# Patient Record
Sex: Female | Born: 1945
Health system: Southern US, Community
[De-identification: ages and names within clinical notes are randomized; demographics above are authoritative.]

## PROBLEM LIST (undated history)

## (undated) DIAGNOSIS — D649 Anemia, unspecified: Secondary | ICD-10-CM

## (undated) DIAGNOSIS — F419 Anxiety disorder, unspecified: Secondary | ICD-10-CM

## (undated) DIAGNOSIS — R112 Nausea with vomiting, unspecified: Secondary | ICD-10-CM

## (undated) DIAGNOSIS — E785 Hyperlipidemia, unspecified: Secondary | ICD-10-CM

## (undated) DIAGNOSIS — E119 Type 2 diabetes mellitus without complications: Secondary | ICD-10-CM

## (undated) DIAGNOSIS — E669 Obesity, unspecified: Secondary | ICD-10-CM

## (undated) DIAGNOSIS — K589 Irritable bowel syndrome without diarrhea: Secondary | ICD-10-CM

## (undated) DIAGNOSIS — I10 Essential (primary) hypertension: Secondary | ICD-10-CM

## (undated) DIAGNOSIS — K219 Gastro-esophageal reflux disease without esophagitis: Secondary | ICD-10-CM

## (undated) DIAGNOSIS — G4700 Insomnia, unspecified: Secondary | ICD-10-CM

## (undated) DIAGNOSIS — Z8719 Personal history of other diseases of the digestive system: Secondary | ICD-10-CM

## (undated) DIAGNOSIS — Z9889 Other specified postprocedural states: Secondary | ICD-10-CM

## (undated) DIAGNOSIS — M1990 Unspecified osteoarthritis, unspecified site: Secondary | ICD-10-CM

## (undated) HISTORY — DX: Hyperlipidemia, unspecified: E78.5

## (undated) HISTORY — DX: Anxiety disorder, unspecified: F41.9

## (undated) HISTORY — PX: BREAST BIOPSY: SHX20

## (undated) HISTORY — DX: Essential (primary) hypertension: I10

## (undated) HISTORY — PX: SHOULDER ARTHROSCOPY WITH OPEN ROTATOR CUFF REPAIR: SHX6092

## (undated) HISTORY — PX: COLONOSCOPY: SHX174

## (undated) HISTORY — DX: Obesity, unspecified: E66.9

## (undated) HISTORY — PX: FOOT SURGERY: SHX648

## (undated) HISTORY — DX: Insomnia, unspecified: G47.00

## (undated) HISTORY — DX: Type 2 diabetes mellitus without complications: E11.9

---

## 1979-12-30 HISTORY — PX: TUBAL LIGATION: SHX77

## 1980-12-29 HISTORY — PX: ABDOMINAL HYSTERECTOMY: SHX81

## 1998-12-19 ENCOUNTER — Other Ambulatory Visit: Admission: RE | Admit: 1998-12-19 | Discharge: 1998-12-19 | Payer: Self-pay | Admitting: Gynecology

## 2000-03-03 ENCOUNTER — Other Ambulatory Visit: Admission: RE | Admit: 2000-03-03 | Discharge: 2000-03-03 | Payer: Self-pay | Admitting: Gynecology

## 2000-03-03 ENCOUNTER — Encounter: Admission: RE | Admit: 2000-03-03 | Discharge: 2000-03-03 | Payer: Self-pay | Admitting: Gynecology

## 2000-03-03 ENCOUNTER — Encounter: Payer: Self-pay | Admitting: Gynecology

## 2001-03-30 ENCOUNTER — Other Ambulatory Visit: Admission: RE | Admit: 2001-03-30 | Discharge: 2001-03-30 | Payer: Self-pay | Admitting: Gynecology

## 2001-03-31 ENCOUNTER — Encounter: Payer: Self-pay | Admitting: Gynecology

## 2001-03-31 ENCOUNTER — Encounter: Admission: RE | Admit: 2001-03-31 | Discharge: 2001-03-31 | Payer: Self-pay | Admitting: Gynecology

## 2002-04-18 ENCOUNTER — Encounter: Payer: Self-pay | Admitting: Gynecology

## 2002-04-18 ENCOUNTER — Encounter: Admission: RE | Admit: 2002-04-18 | Discharge: 2002-04-18 | Payer: Self-pay | Admitting: Gynecology

## 2002-04-19 ENCOUNTER — Other Ambulatory Visit: Admission: RE | Admit: 2002-04-19 | Discharge: 2002-04-19 | Payer: Self-pay | Admitting: Gynecology

## 2003-10-10 ENCOUNTER — Encounter: Admission: RE | Admit: 2003-10-10 | Discharge: 2003-10-10 | Payer: Self-pay | Admitting: Gynecology

## 2003-10-10 ENCOUNTER — Encounter: Payer: Self-pay | Admitting: Gynecology

## 2004-02-05 ENCOUNTER — Ambulatory Visit (HOSPITAL_COMMUNITY): Admission: RE | Admit: 2004-02-05 | Discharge: 2004-02-05 | Payer: Self-pay | Admitting: *Deleted

## 2004-03-01 ENCOUNTER — Other Ambulatory Visit: Admission: RE | Admit: 2004-03-01 | Discharge: 2004-03-01 | Payer: Self-pay | Admitting: Gynecology

## 2004-11-05 ENCOUNTER — Encounter: Admission: RE | Admit: 2004-11-05 | Discharge: 2004-11-05 | Payer: Self-pay | Admitting: Gynecology

## 2005-03-19 ENCOUNTER — Other Ambulatory Visit: Admission: RE | Admit: 2005-03-19 | Discharge: 2005-03-19 | Payer: Self-pay | Admitting: Gynecology

## 2005-12-09 ENCOUNTER — Encounter: Admission: RE | Admit: 2005-12-09 | Discharge: 2005-12-09 | Payer: Self-pay | Admitting: Gynecology

## 2006-04-21 ENCOUNTER — Other Ambulatory Visit: Admission: RE | Admit: 2006-04-21 | Discharge: 2006-04-21 | Payer: Self-pay | Admitting: Gynecology

## 2007-01-25 ENCOUNTER — Encounter: Admission: RE | Admit: 2007-01-25 | Discharge: 2007-01-25 | Payer: Self-pay | Admitting: Gynecology

## 2007-04-27 ENCOUNTER — Other Ambulatory Visit: Admission: RE | Admit: 2007-04-27 | Discharge: 2007-04-27 | Payer: Self-pay | Admitting: Gynecology

## 2008-02-17 ENCOUNTER — Encounter: Admission: RE | Admit: 2008-02-17 | Discharge: 2008-02-17 | Payer: Self-pay | Admitting: Gynecology

## 2009-02-19 ENCOUNTER — Encounter: Admission: RE | Admit: 2009-02-19 | Discharge: 2009-02-19 | Payer: Self-pay | Admitting: Gynecology

## 2010-02-20 ENCOUNTER — Encounter: Admission: RE | Admit: 2010-02-20 | Discharge: 2010-02-20 | Payer: Self-pay | Admitting: Gynecology

## 2011-03-04 ENCOUNTER — Other Ambulatory Visit: Payer: Self-pay | Admitting: Gynecology

## 2011-03-04 DIAGNOSIS — Z1231 Encounter for screening mammogram for malignant neoplasm of breast: Secondary | ICD-10-CM

## 2011-03-14 ENCOUNTER — Ambulatory Visit
Admission: RE | Admit: 2011-03-14 | Discharge: 2011-03-14 | Disposition: A | Discharge status: Home / Self Care | Payer: Commercial Indemnity | Source: Ambulatory Visit | Attending: Gynecology | Admitting: Gynecology

## 2011-03-14 DIAGNOSIS — Z1231 Encounter for screening mammogram for malignant neoplasm of breast: Secondary | ICD-10-CM

## 2012-05-10 ENCOUNTER — Other Ambulatory Visit: Payer: Self-pay | Admitting: Gynecology

## 2012-05-10 DIAGNOSIS — Z1231 Encounter for screening mammogram for malignant neoplasm of breast: Secondary | ICD-10-CM

## 2012-06-01 ENCOUNTER — Ambulatory Visit
Admission: RE | Admit: 2012-06-01 | Discharge: 2012-06-01 | Disposition: A | Discharge status: Home / Self Care | Payer: 59 | Source: Ambulatory Visit | Attending: Gynecology | Admitting: Gynecology

## 2012-06-01 DIAGNOSIS — Z1231 Encounter for screening mammogram for malignant neoplasm of breast: Secondary | ICD-10-CM

## 2012-08-02 DIAGNOSIS — F411 Generalized anxiety disorder: Secondary | ICD-10-CM | POA: Diagnosis not present

## 2012-08-02 DIAGNOSIS — G479 Sleep disorder, unspecified: Secondary | ICD-10-CM | POA: Diagnosis not present

## 2012-08-02 DIAGNOSIS — I10 Essential (primary) hypertension: Secondary | ICD-10-CM | POA: Diagnosis not present

## 2012-08-02 DIAGNOSIS — E782 Mixed hyperlipidemia: Secondary | ICD-10-CM | POA: Diagnosis not present

## 2012-09-13 DIAGNOSIS — B880 Other acariasis: Secondary | ICD-10-CM | POA: Diagnosis not present

## 2012-09-13 DIAGNOSIS — R21 Rash and other nonspecific skin eruption: Secondary | ICD-10-CM | POA: Diagnosis not present

## 2012-11-15 ENCOUNTER — Other Ambulatory Visit: Payer: Self-pay | Admitting: Family Medicine

## 2012-11-15 DIAGNOSIS — I10 Essential (primary) hypertension: Secondary | ICD-10-CM | POA: Diagnosis not present

## 2012-11-15 DIAGNOSIS — E049 Nontoxic goiter, unspecified: Secondary | ICD-10-CM | POA: Diagnosis not present

## 2012-11-15 DIAGNOSIS — E010 Iodine-deficiency related diffuse (endemic) goiter: Secondary | ICD-10-CM

## 2012-11-17 ENCOUNTER — Ambulatory Visit
Admission: RE | Admit: 2012-11-17 | Discharge: 2012-11-17 | Disposition: A | Discharge status: Home / Self Care | Payer: 59 | Source: Ambulatory Visit | Attending: Family Medicine | Admitting: Family Medicine

## 2012-11-17 DIAGNOSIS — E010 Iodine-deficiency related diffuse (endemic) goiter: Secondary | ICD-10-CM

## 2012-11-17 DIAGNOSIS — E041 Nontoxic single thyroid nodule: Secondary | ICD-10-CM | POA: Diagnosis not present

## 2012-11-18 ENCOUNTER — Other Ambulatory Visit: Payer: Self-pay | Admitting: Family Medicine

## 2012-11-18 DIAGNOSIS — E041 Nontoxic single thyroid nodule: Secondary | ICD-10-CM

## 2012-12-01 ENCOUNTER — Ambulatory Visit
Admission: RE | Admit: 2012-12-01 | Discharge: 2012-12-01 | Disposition: A | Discharge status: Home / Self Care | Payer: Medicare Other | Source: Ambulatory Visit | Attending: Family Medicine | Admitting: Family Medicine

## 2012-12-01 ENCOUNTER — Other Ambulatory Visit (HOSPITAL_COMMUNITY)
Admission: RE | Admit: 2012-12-01 | Discharge: 2012-12-01 | Disposition: A | Discharge status: Home / Self Care | Payer: Medicare Other | Source: Ambulatory Visit | Attending: Interventional Radiology | Admitting: Interventional Radiology

## 2012-12-01 DIAGNOSIS — E041 Nontoxic single thyroid nodule: Secondary | ICD-10-CM | POA: Diagnosis not present

## 2012-12-01 DIAGNOSIS — E049 Nontoxic goiter, unspecified: Secondary | ICD-10-CM | POA: Insufficient documentation

## 2012-12-16 DIAGNOSIS — G479 Sleep disorder, unspecified: Secondary | ICD-10-CM | POA: Diagnosis not present

## 2012-12-16 DIAGNOSIS — E669 Obesity, unspecified: Secondary | ICD-10-CM | POA: Diagnosis not present

## 2012-12-16 DIAGNOSIS — E782 Mixed hyperlipidemia: Secondary | ICD-10-CM | POA: Diagnosis not present

## 2012-12-16 DIAGNOSIS — I10 Essential (primary) hypertension: Secondary | ICD-10-CM | POA: Diagnosis not present

## 2012-12-16 DIAGNOSIS — F411 Generalized anxiety disorder: Secondary | ICD-10-CM | POA: Diagnosis not present

## 2012-12-16 DIAGNOSIS — K589 Irritable bowel syndrome without diarrhea: Secondary | ICD-10-CM | POA: Diagnosis not present

## 2012-12-16 DIAGNOSIS — E041 Nontoxic single thyroid nodule: Secondary | ICD-10-CM | POA: Diagnosis not present

## 2012-12-31 ENCOUNTER — Encounter (INDEPENDENT_AMBULATORY_CARE_PROVIDER_SITE_OTHER): Payer: Self-pay

## 2012-12-31 ENCOUNTER — Encounter (INDEPENDENT_AMBULATORY_CARE_PROVIDER_SITE_OTHER): Payer: Self-pay | Admitting: Surgery

## 2013-01-03 ENCOUNTER — Ambulatory Visit (INDEPENDENT_AMBULATORY_CARE_PROVIDER_SITE_OTHER): Payer: Medicare Other | Admitting: Surgery

## 2013-01-03 ENCOUNTER — Encounter (INDEPENDENT_AMBULATORY_CARE_PROVIDER_SITE_OTHER): Payer: Self-pay | Admitting: Surgery

## 2013-01-03 VITALS — HR 81 | Temp 97.8°F | Ht 63.5 in | Wt 180.8 lb

## 2013-01-03 DIAGNOSIS — D440 Neoplasm of uncertain behavior of thyroid gland: Secondary | ICD-10-CM | POA: Insufficient documentation

## 2013-01-03 DIAGNOSIS — D449 Neoplasm of uncertain behavior of unspecified endocrine gland: Secondary | ICD-10-CM

## 2013-01-03 NOTE — Progress Notes (Signed)
General Surgery [facility]  Chief Complaint  Patient presents with  . New Evaluation    eval right thyroid nodule - referral from Dr. Sigmund Hazel    HISTORY: The patient is a 67 year old white female referred by her primary care physician for evaluation of a newly diagnosed right-sided solitary thyroid nodule. Patient was found on an employment physical to have a thyroid nodule on physical exam. She was seen by her primary care physician and a thyroid ultrasound was obtained. This showed a normal sized thyroid gland with a solitary 3.0 cm nodule in the mid right thyroid lobe. Fine needle aspiration biopsy was performed and showed a follicular lesion with a few microfollicles. No other worrisome features were identified.  Patient is now referred for surgical evaluation and recommendations.  Patient has no prior history of thyroid disease. She has never been on thyroid medication. She has no history of head or neck surgery. There is no family history of thyroid disease and specifically no history of other endocrinopathies or cancer.  Past Medical History  Diagnosis Date  . Anxiety   . Hypertension   . Hyperlipidemia   . Insomnia   . Obesity      Current Outpatient Prescriptions  Medication Sig Dispense Refill  . amLODipine (NORVASC) 5 MG tablet Take 5 mg by mouth daily.      . hydrochlorothiazide (HYDRODIURIL) 25 MG tablet Take 25 mg by mouth daily.      . hyoscyamine (LEVSIN, ANASPAZ) 0.125 MG tablet Take 0.125 mg by mouth every 4 (four) hours as needed.      Marland Kitchen LORazepam (ATIVAN) 0.5 MG tablet Take 0.5 mg by mouth every 8 (eight) hours.      Marland Kitchen losartan (COZAAR) 100 MG tablet Take 100 mg by mouth daily.      . Multiple Vitamins-Minerals (MULTIVITAMIN WITH MINERALS) tablet Take 1 tablet by mouth daily.      . Probiotic Product (PROBIOTIC DAILY PO) Take by mouth.      . simvastatin (ZOCOR) 20 MG tablet Take 20 mg by mouth every evening.      . zolpidem (AMBIEN CR)  12.5 MG CR tablet Take 12.5 mg by mouth at bedtime as needed.         Allergies  Allergen Reactions  . Codeine Itching  . Lipitor (Atorvastatin) Other (See Comments)  . Sulfa Antibiotics      Family History  Problem Relation Age of Onset  . Cancer Mother     lung  . Heart disease Father      History   Social History  . Marital Status: Married    Spouse Name: N/A    Number of Children: N/A  . Years of Education: N/A   Social History Main Topics  . Smoking status: Never Smoker   . Smokeless tobacco: None  . Alcohol Use: No  . Drug Use: No  . Sexually Active:    Other Topics Concern  . None   Social History Narrative  . None     REVIEW OF SYSTEMS - PERTINENT POSITIVES ONLY: Denies compressive symptoms. Denies tremor. Denies palpitations.  EXAMCeasar Mons Vitals:   01/03/13 1617  Pulse: 81  Temp: 97.8 F (36.6 C)    HEENT: normocephalic; pupils equal and reactive; sclerae clear; dentition good; mucous membranes moist NECK:  Relatively soft mobile non-tender 3 cm nodule in the right thyroid lobe; left lobe without palpable abnormality; slight asymmetric on extension; no palpable anterior or posterior cervical lymphadenopathy; no  supraclavicular masses; no tenderness CHEST: clear to auscultation bilaterally without rales, rhonchi, or wheezes CARDIAC: regular rate and rhythm without significant murmur; peripheral pulses are full EXT:  non-tender without edema; no deformity NEURO: no gross focal deficits; no sign of tremor   LABORATORY RESULTS: See [facility] (CHL-Epic) for most recent results   RADIOLOGY RESULTS: See [facility] (CHL-Epic) for most recent results   IMPRESSION: Solitary right thyroid nodule, 3 cm, of uncertain behavior  PLAN: I had a lengthy discussion with the patient and her husband. We discussed options for management. This includes continued close observation, suppression with thyroid hormone, and surgical resection. After  careful consideration we elected to close observation.  I have asked the patient to return in 6 months for follow-up thyroid ultrasound and TSH level. I will repeat her physical examination at that time.  If there is any significant change in the size or character of this lesion, we will consider surgical resection. Otherwise, I believe the risk of malignancy in this lesion is well less than 10% and can be safely observed. Patient and her husband are comfortable with this approach.  We will see the patient in 6 months.  Velora Heckler, MD, FACS General & Endocrine Surgery [facility]   Visit Diagnoses: 1. Neoplasm of uncertain behavior of thyroid gland, right     Primary Care Physician: Neldon Labella, MD

## 2013-01-03 NOTE — Patient Instructions (Signed)
Thyroid Diseases Your thyroid is a butterfly-shaped gland in your neck. It is located just above your collarbone. It is one of your endocrine glands, which make hormones. The thyroid helps set your metabolism. Metabolism is how your body gets energy from the foods you eat.  Millions of people have thyroid diseases. Women experience thyroid problems more often than men. In fact, overactive thyroid problems (hyperthyroidism) occur in 1% of all women. If you have a thyroid disease, your body may use energy more slowly or quickly than it should.  Thyroid problems also include an immune disease where your body reacts against your thyroid gland (called thyroiditis). A different problem involves lumps and bumps (called nodules) that develop in the gland. The nodules are usually, but not always, noncancerous. THE MOST COMMON THYROID PROBLEMS AND CAUSES ARE DISCUSSED BELOW There are many causes for thyroid problems. Treatment depends upon the exact diagnosis and includes trying to reset your body's metabolism to a normal rate. Hyperthyroidism Too much thyroid hormone from an overactive thyroid gland is called hyperthyroidism. In hyperthyroidism, the body's metabolism speeds up. One of the most frequent forms of hyperthyroidism is known as Graves' disease. Graves' disease tends to run in families. Although Graves' is thought to be caused by a problem with the immune system, the exact nature of the genetic problem is unknown. Hypothyroidism Too little thyroid hormone from an underactive thyroid gland is called hypothyroidism. In hypothyroidism, the body's metabolism is slowed. Several things can cause this condition. Most causes affect the thyroid gland directly and hurt its ability to make enough hormone.  Rarely, there may be a pituitary gland tumor (located near the base of the brain). The tumor can block the pituitary from producing thyroid-stimulating hormone (TSH). Your body makes TSH to stimulate the thyroid  to work properly. If the pituitary does not make enough TSH, the thyroid fails to make enough hormones needed for good health. Whether the problem is caused by thyroid conditions or by the pituitary gland, the result is that the thyroid is not making enough hormones. Hypothyroidism causes many physical and mental processes to become sluggish. The body consumes less oxygen and produces less body heat. Thyroid Nodules A thyroid nodule is a small swelling or lump in the thyroid gland. They are common. These nodules represent either a growth of thyroid tissue or a fluid-filled cyst. Both form a lump in the thyroid gland. Almost half of all people will have tiny thyroid nodules at some point in their lives. Typically, these are not noticeable until they become large and affect normal thyroid size. Larger nodules that are greater than a half inch across (about 1 centimeter) occur in about 5 percent of people. Although most nodules are not cancerous, people who have them should seek medical care to rule out cancer. Also, some thyroid nodules may produce too much thyroid hormone or become too large. Large nodules or a large gland can interfere with breathing or swallowing or may cause neck discomfort. Other problems Other thyroid problems include cancer and thyroiditis. Thyroiditis is a malfunction of the body's immune system. Normally, the immune system works to defend the body against infection and other problems. When the immune system is not working properly, it may mistakenly attack normal cells, tissues, and organs. Examples of autoimmune diseases are Hashimoto's thyroiditis (which causes low thyroid function) and Graves' disease (which causes excess thyroid function). SYMPTOMS  Symptoms vary greatly depending upon the exact type of problem with the thyroid. Hyperthyroidism-is when your thyroid is too   active and makes more thyroid hormone than your body needs. The most common cause is Graves' Disease. Too  much thyroid hormone can cause some or all of the following symptoms:  Anxiety.  Irritability.  Difficulty sleeping.  Fatigue.  A rapid or irregular heartbeat.  A fine tremor of your hands or fingers.  An increase in perspiration.  Sensitivity to heat.  Weight loss, despite normal food intake.  Brittle hair.  Enlargement of your thyroid gland (goiter).  Light menstrual periods.  Frequent bowel movements. Graves' disease can specifically cause eye and skin problems. The skin problems involve reddening and swelling of the skin, often on your shins and on the top of your feet. Eye problems can include the following:  Excess tearing and sensation of grit or sand in either or both eyes.  Reddened or inflamed eyes.  Widening of the space between your eyelids.  Swelling of the lids and tissues around the eyes.  Light sensitivity.  Ulcers on the cornea.  Double vision.  Limited eye movements.  Blurred or reduced vision. Hypothyroidism- is when your thyroid gland is not active enough. This is more common than hyperthyroidism. Symptoms can vary a lot depending of the severity of the hormone deficiency. Symptoms may develop over a long period of time and can include several of the following:  Fatigue.  Sluggishness.  Increased sensitivity to cold.  Constipation.  Pale, dry skin.  A puffy face.  Hoarse voice.  High blood cholesterol level.  Unexplained weight gain.  Muscle aches, tenderness and stiffness.  Pain, stiffness or swelling in your joints.  Muscle weakness.  Heavier than normal menstrual periods.  Brittle fingernails and hair.  Depression. Thyroid Nodules - most do not cause signs or symptoms. Occasionally, some may become so large that you can feel or even see the swelling at the base of your neck. You may realize a lump or swelling is there when you are shaving or putting on makeup. Men might become aware of a nodule when shirt collars  suddenly feel too tight. Some nodules produce too much thyroid hormone. This can produce the same symptoms as hyperthyroidism (see above). Thyroid nodules are seldom cancerous. However, a nodule is more likely to be malignant (cancerous) if it:  Grows quickly or feels hard.  Causes you to become hoarse or to have trouble swallowing or breathing.  Causes enlarged lymph nodes under your jaw or in your neck. DIAGNOSIS  Because there are so many possible thyroid conditions, your caregiver may ask for a number of tests. They will do this in order to narrow down the exact diagnosis. These tests can include:  Blood and antibody tests.  Special thyroid scans using small, safe amounts of radioactive iodine.  Ultrasound of the thyroid gland (particularly if there is a nodule or lump).  Biopsy. This is usually done with a special needle. A needle biopsy is a procedure to obtain a sample of cells from the thyroid. The tissue will be tested in a lab and examined under a microscope. TREATMENT  Treatment depends on the exact diagnosis. Hyperthyroidism  Beta-blockers help relieve many of the symptoms.  Anti-thyroid medications prevent the thyroid from making excess hormones.  Radioactive iodine treatment can destroy overactive thyroid cells. The iodine can permanently decrease the amount of hormone produced.  Surgery to remove the thyroid gland.  Treatments for eye problems that come from Graves' disease also include medications and special eye surgery, if felt to be appropriate. Hypothyroidism Thyroid replacement with levothyroxine is   the mainstay of treatment. Treatment with thyroid replacement is usually lifelong and will require monitoring and adjustment from time to time. Thyroid Nodules  Watchful waiting. If a small nodule causes no symptoms or signs of cancer on biopsy, then no treatment may be chosen at first. Re-exam and re-checking blood tests would be the recommended  follow-up.  Anti-thyroid medications or radioactive iodine treatment may be recommended if the nodules produce too much thyroid hormone (see Treatment for Hyperthyroidism above).  Alcohol ablation. Injections of small amounts of ethyl alcohol (ethanol) can cause a non-cancerous nodule to shrink in size.  Surgery (see Treatment for Hyperthyroidism above). HOME CARE INSTRUCTIONS   Take medications as instructed.  Follow through on recommended testing. SEEK MEDICAL CARE IF:   You feel that you are developing symptoms of Hyperthyroidism or Hypothyroidism as described above.  You develop a new lump/nodule in the neck/thyroid area that you had not noticed before.  You feel that you are having side effects from medicines prescribed.  You develop trouble breathing or swallowing. SEEK IMMEDIATE MEDICAL CARE IF:   You develop a fever of 102 F (38.9 C) or higher.  You develop severe sweating.  You develop palpitations and/or rapid heart beat.  You develop shortness of breath.  You develop nausea and vomiting.  You develop extreme shakiness.  You develop agitation.  You develop lightheadedness or have a fainting episode. Document Released: 10/12/2007 Document Revised: 03/08/2012 Document Reviewed: 10/12/2007 ExitCare Patient Information 2013 [employer].  

## 2013-01-14 ENCOUNTER — Telehealth (INDEPENDENT_AMBULATORY_CARE_PROVIDER_SITE_OTHER): Payer: Self-pay

## 2013-01-14 NOTE — Telephone Encounter (Signed)
Date of u/s and lab slip mailed to pt.

## 2013-02-03 DIAGNOSIS — R319 Hematuria, unspecified: Secondary | ICD-10-CM | POA: Diagnosis not present

## 2013-02-03 DIAGNOSIS — I10 Essential (primary) hypertension: Secondary | ICD-10-CM | POA: Diagnosis not present

## 2013-04-21 DIAGNOSIS — E782 Mixed hyperlipidemia: Secondary | ICD-10-CM | POA: Diagnosis not present

## 2013-04-21 DIAGNOSIS — F411 Generalized anxiety disorder: Secondary | ICD-10-CM | POA: Diagnosis not present

## 2013-04-21 DIAGNOSIS — I10 Essential (primary) hypertension: Secondary | ICD-10-CM | POA: Diagnosis not present

## 2013-04-21 DIAGNOSIS — R7309 Other abnormal glucose: Secondary | ICD-10-CM | POA: Diagnosis not present

## 2013-04-21 DIAGNOSIS — G479 Sleep disorder, unspecified: Secondary | ICD-10-CM | POA: Diagnosis not present

## 2013-04-21 DIAGNOSIS — K602 Anal fissure, unspecified: Secondary | ICD-10-CM | POA: Diagnosis not present

## 2013-04-21 DIAGNOSIS — Z23 Encounter for immunization: Secondary | ICD-10-CM | POA: Diagnosis not present

## 2013-05-08 DIAGNOSIS — L255 Unspecified contact dermatitis due to plants, except food: Secondary | ICD-10-CM | POA: Diagnosis not present

## 2013-06-03 ENCOUNTER — Ambulatory Visit
Admission: RE | Admit: 2013-06-03 | Discharge: 2013-06-03 | Disposition: A | Discharge status: Home / Self Care | Payer: Medicare Other | Source: Ambulatory Visit | Attending: Surgery | Admitting: Surgery

## 2013-06-03 DIAGNOSIS — E041 Nontoxic single thyroid nodule: Secondary | ICD-10-CM | POA: Diagnosis not present

## 2013-06-03 DIAGNOSIS — D440 Neoplasm of uncertain behavior of thyroid gland: Secondary | ICD-10-CM

## 2013-06-07 ENCOUNTER — Other Ambulatory Visit: Payer: Self-pay

## 2013-06-07 DIAGNOSIS — L738 Other specified follicular disorders: Secondary | ICD-10-CM | POA: Diagnosis not present

## 2013-06-07 DIAGNOSIS — L821 Other seborrheic keratosis: Secondary | ICD-10-CM | POA: Diagnosis not present

## 2013-06-07 DIAGNOSIS — D235 Other benign neoplasm of skin of trunk: Secondary | ICD-10-CM | POA: Diagnosis not present

## 2013-06-07 DIAGNOSIS — D2330 Other benign neoplasm of skin of unspecified part of face: Secondary | ICD-10-CM | POA: Diagnosis not present

## 2013-06-07 DIAGNOSIS — D1801 Hemangioma of skin and subcutaneous tissue: Secondary | ICD-10-CM | POA: Diagnosis not present

## 2013-06-22 ENCOUNTER — Other Ambulatory Visit (INDEPENDENT_AMBULATORY_CARE_PROVIDER_SITE_OTHER): Payer: Self-pay

## 2013-06-22 ENCOUNTER — Telehealth (INDEPENDENT_AMBULATORY_CARE_PROVIDER_SITE_OTHER): Payer: Self-pay

## 2013-06-22 DIAGNOSIS — E042 Nontoxic multinodular goiter: Secondary | ICD-10-CM

## 2013-06-22 NOTE — Telephone Encounter (Signed)
LMOM pt due for f/u thyroid appt with Dr Gerrit Friends. We have received u/s result but need to know if pt went for her tsh and if yes where.

## 2013-06-22 NOTE — Telephone Encounter (Signed)
Pt call back. Appt made. Lab slip mailed to pt.

## 2013-06-28 ENCOUNTER — Other Ambulatory Visit (INDEPENDENT_AMBULATORY_CARE_PROVIDER_SITE_OTHER): Payer: Self-pay | Admitting: Surgery

## 2013-06-28 DIAGNOSIS — E042 Nontoxic multinodular goiter: Secondary | ICD-10-CM | POA: Diagnosis not present

## 2013-07-11 ENCOUNTER — Ambulatory Visit (INDEPENDENT_AMBULATORY_CARE_PROVIDER_SITE_OTHER): Payer: Medicare Other | Admitting: Surgery

## 2013-07-11 ENCOUNTER — Encounter (INDEPENDENT_AMBULATORY_CARE_PROVIDER_SITE_OTHER): Payer: Self-pay | Admitting: Surgery

## 2013-07-11 VITALS — BP 148/80 | HR 78 | Temp 98.6°F | Resp 18 | Ht 63.0 in | Wt 178.0 lb

## 2013-07-11 DIAGNOSIS — D449 Neoplasm of uncertain behavior of unspecified endocrine gland: Secondary | ICD-10-CM

## 2013-07-11 DIAGNOSIS — D440 Neoplasm of uncertain behavior of thyroid gland: Secondary | ICD-10-CM

## 2013-07-11 NOTE — Patient Instructions (Signed)

## 2013-07-11 NOTE — Progress Notes (Signed)
General Surgery [facility]  Visit Diagnoses: 1. Neoplasm of uncertain behavior of thyroid gland, right     HISTORY: Patient is a 67 year old female followed for right-sided thyroid nodule. At my request she underwent a followup ultrasound on 06/03/2013. This showed the gland to be stable in overall size. The solitary dominant nodule was in the right thyroid lobe and measured 2.9 x 2.0 x 2.2 cm. This was largely unchanged from her measurements at the time of biopsy in December 2013.  Patient also had laboratory studies including a TSH level which is normal at 3.573. Patient has not been on thyroid hormone.  PERTINENT REVIEW OF SYSTEMS: Denies compressive symptoms. Denies tremor. Denies palpitations. Denies discomfort.  EXAM: HEENT: normocephalic; pupils equal and reactive; sclerae clear; dentition good; mucous membranes moist NECK:  Dominant palpable nodule mid to inferior right thyroid lobe, approximately 2.5-3 cm in size, mobile with swallowing, nontender; left thyroid lobe without palpable abnormalities; asymmetric on extension; no palpable anterior or posterior cervical lymphadenopathy; no supraclavicular masses; no tenderness CHEST: clear to auscultation bilaterally without rales, rhonchi, or wheezes CARDIAC: regular rate and rhythm without significant murmur; peripheral pulses are full EXT:  non-tender without edema; no deformity NEURO: no gross focal deficits; no sign of tremor   IMPRESSION: Dominant right thyroid nodule, 2.9 cm, clinically stable  PLAN: The patient and I reviewed her ultrasound report and her laboratory studies. On exam, the right-sided nodule is clinically unchanged. I think it is safe to continue to monitor this finding. I've asked her to return in one year for physical exam. We will repeat her thyroid ultrasound and her TSH level prior to that office visit. The patient understands and agrees with this plan.  Velora Heckler, MD,  [facility] Office: [phone]

## 2013-07-28 ENCOUNTER — Other Ambulatory Visit: Payer: Self-pay

## 2013-07-28 DIAGNOSIS — Z1231 Encounter for screening mammogram for malignant neoplasm of breast: Secondary | ICD-10-CM

## 2013-08-15 ENCOUNTER — Ambulatory Visit
Admission: RE | Admit: 2013-08-15 | Discharge: 2013-08-15 | Disposition: A | Discharge status: Home / Self Care | Payer: Medicare Other | Source: Ambulatory Visit

## 2013-08-15 DIAGNOSIS — Z1231 Encounter for screening mammogram for malignant neoplasm of breast: Secondary | ICD-10-CM

## 2013-09-06 DIAGNOSIS — N951 Menopausal and female climacteric states: Secondary | ICD-10-CM | POA: Diagnosis not present

## 2013-09-06 DIAGNOSIS — Z0000 Encounter for general adult medical examination without abnormal findings: Secondary | ICD-10-CM | POA: Diagnosis not present

## 2013-09-06 DIAGNOSIS — Z01419 Encounter for gynecological examination (general) (routine) without abnormal findings: Secondary | ICD-10-CM | POA: Diagnosis not present

## 2013-09-06 DIAGNOSIS — K644 Residual hemorrhoidal skin tags: Secondary | ICD-10-CM | POA: Diagnosis not present

## 2013-11-07 DIAGNOSIS — E782 Mixed hyperlipidemia: Secondary | ICD-10-CM | POA: Diagnosis not present

## 2013-11-07 DIAGNOSIS — R7309 Other abnormal glucose: Secondary | ICD-10-CM | POA: Diagnosis not present

## 2013-11-07 DIAGNOSIS — E669 Obesity, unspecified: Secondary | ICD-10-CM | POA: Diagnosis not present

## 2013-11-07 DIAGNOSIS — I10 Essential (primary) hypertension: Secondary | ICD-10-CM | POA: Diagnosis not present

## 2013-11-21 DIAGNOSIS — R197 Diarrhea, unspecified: Secondary | ICD-10-CM | POA: Diagnosis not present

## 2013-11-22 DIAGNOSIS — R197 Diarrhea, unspecified: Secondary | ICD-10-CM | POA: Diagnosis not present

## 2014-02-01 IMAGING — US US SOFT TISSUE HEAD/NECK
1 series · 14 of 25 positions shown · non-contrast
Comparison: None.

CLINICAL DATA: Right neck swelling

THYROID ULTRASOUND
TECHNIQUE: Ultrasound examination of the thyroid gland and adjacent
soft tissues was performed.

[Series 1: us soft tissue head/neck · 0.07mm/px · 14 of 33 slices shown]
[im 1/33]
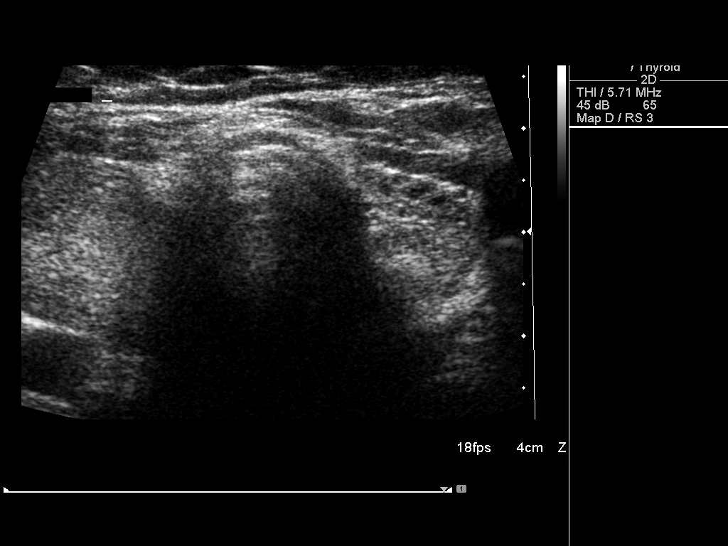
[im 3/33]
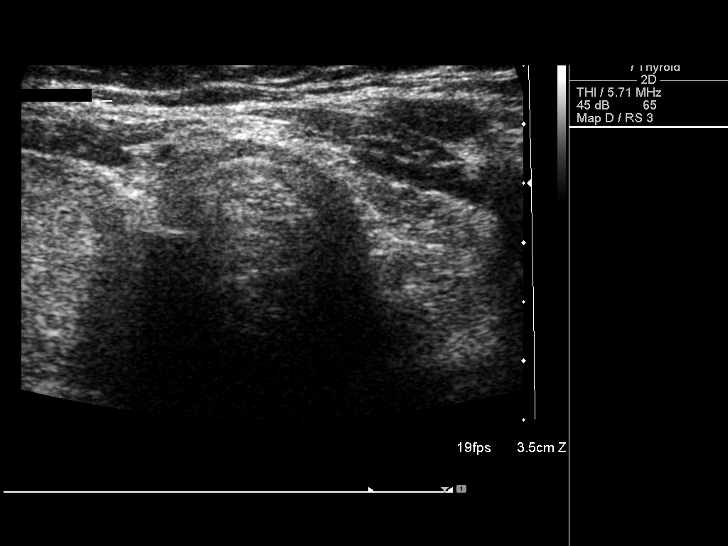
[im 6/33]
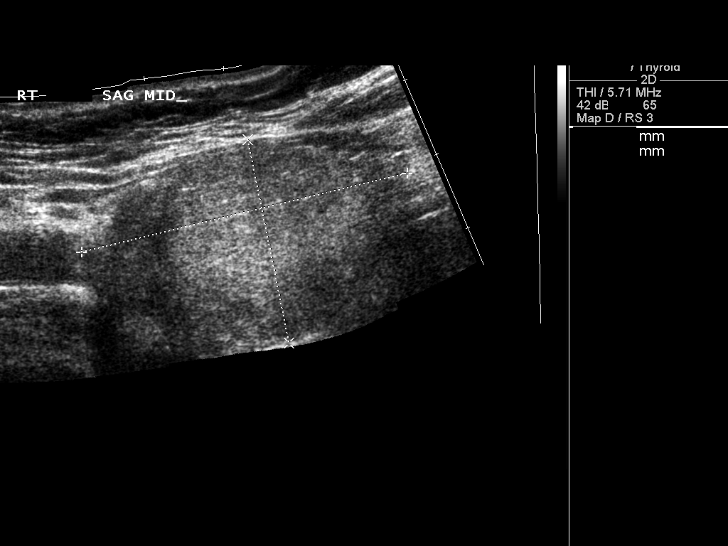
[im 9/33]
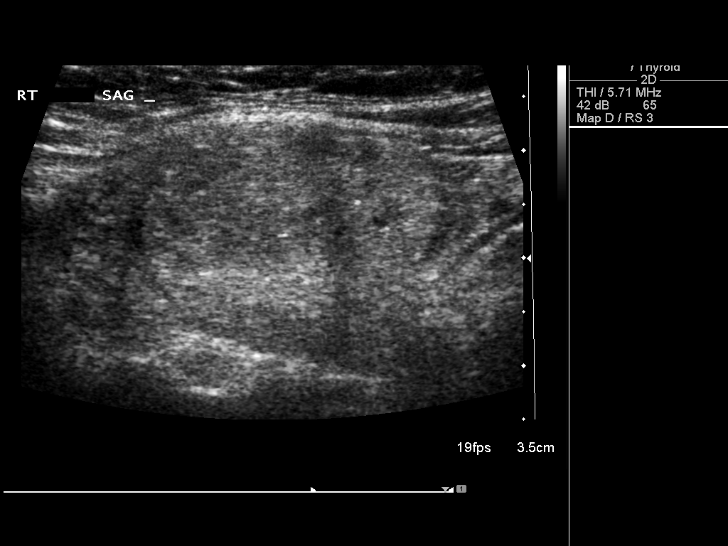
[im 11/33]
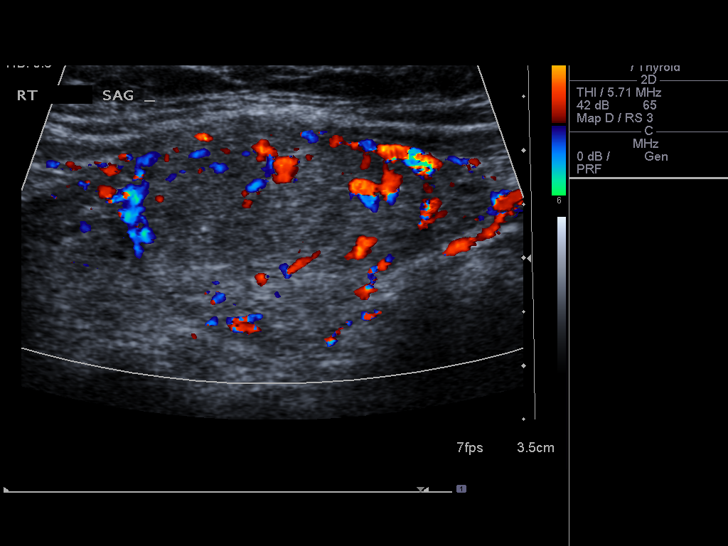
[im 13/33]
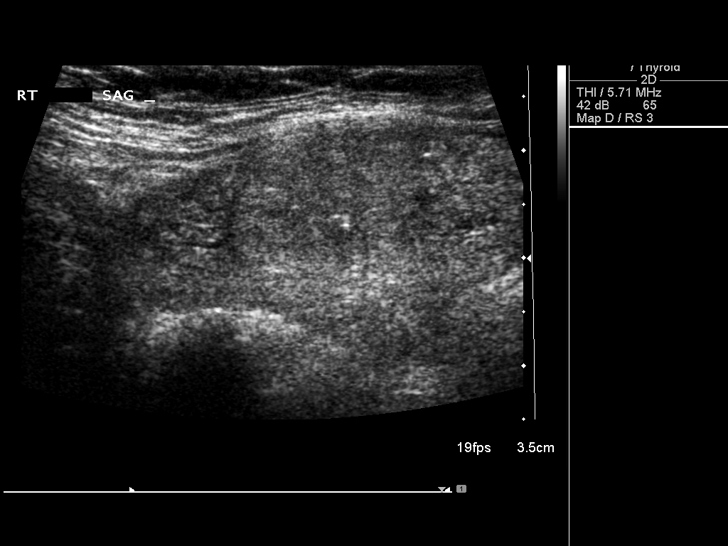
[im 15/33]
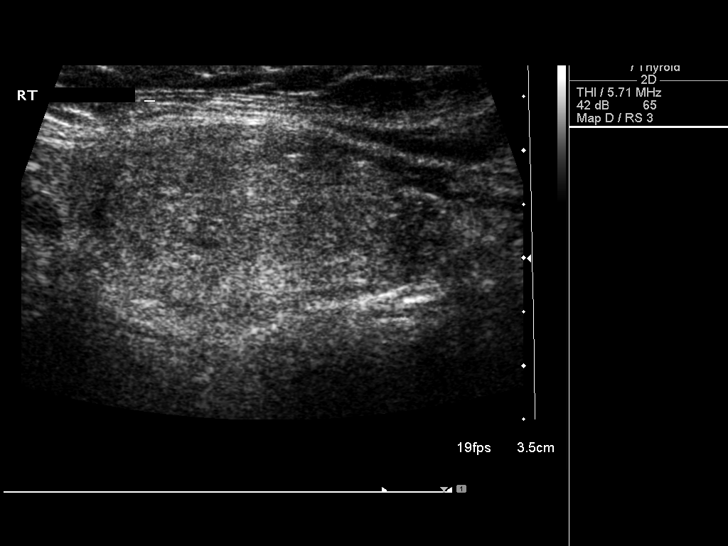
[im 18/33]
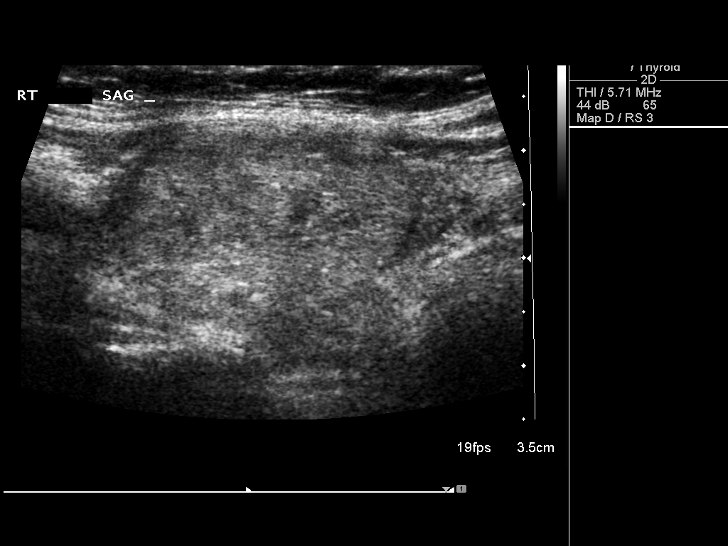
[im 21/33]
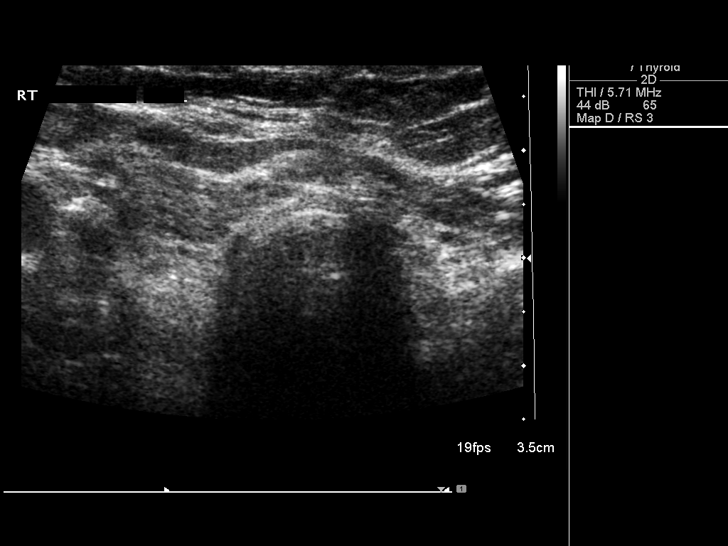
[im 22/33]
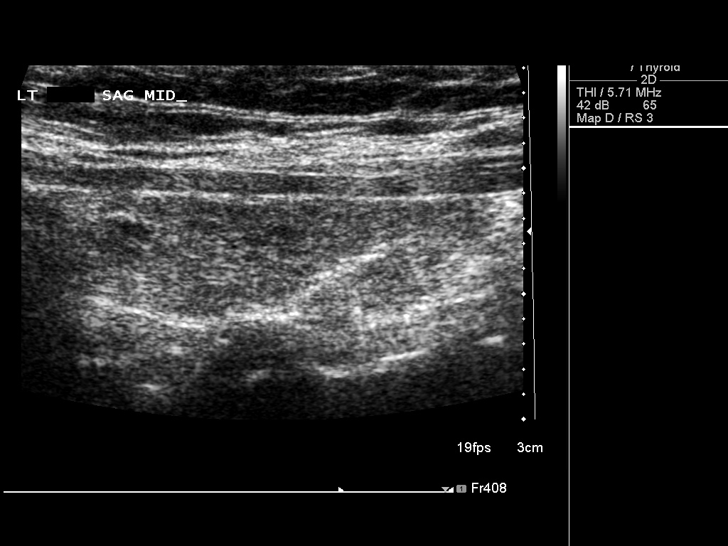
[im 25/33]
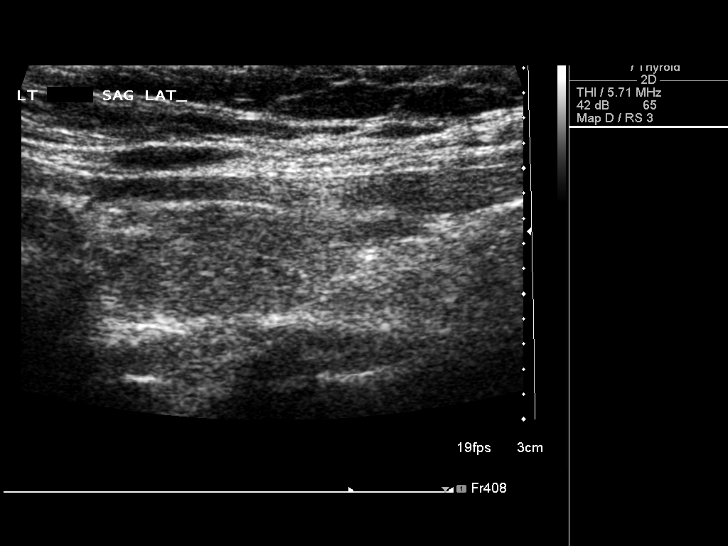
[im 27/33]
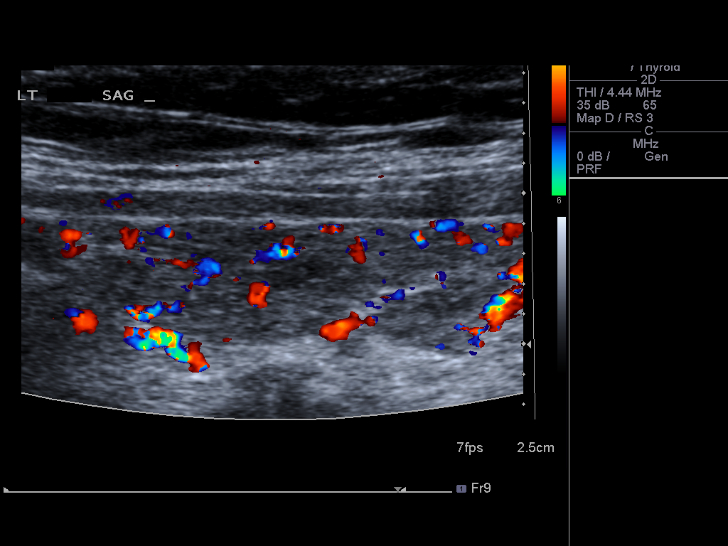
[im 30/33]
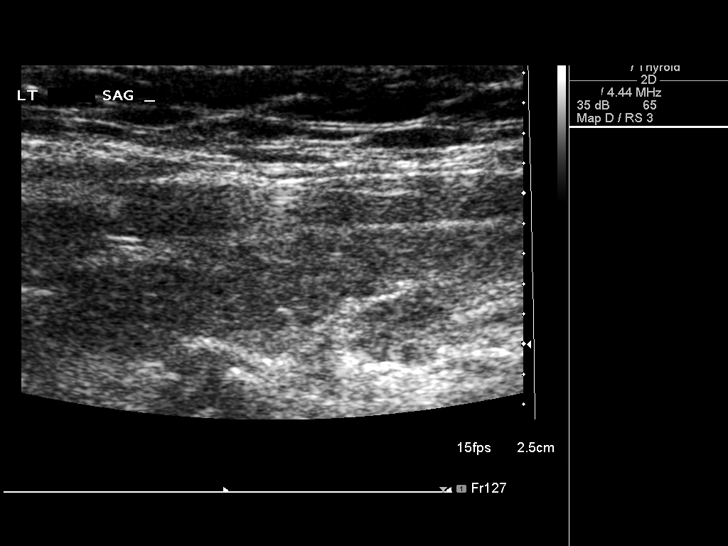
[im 33/33]
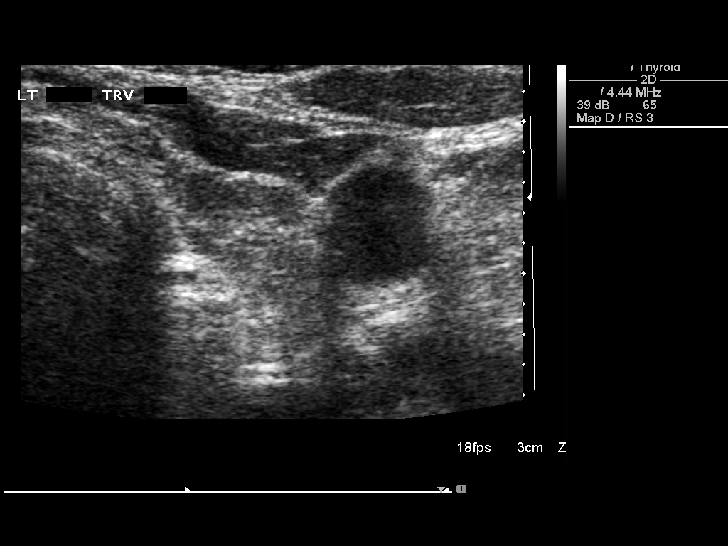

[14 of 25 positions shown; findings below may reference images not displayed]

FINDINGS: Right thyroid lobe:  24 x 26 x 42 mm, inhomogeneous background
echotexture
Left thyroid lobe:  9 x 12 x 33 mm
Isthmus:  3 mm in thickness

Focal nodules:  19 x 24 x 30 mm solid, mid-right

Lymphadenopathy:  None visualized.
IMPRESSION: 1.  Solitary 3 cm right thyroid nodule. Findings meet consensus
criteria for biopsy.  Ultrasound-guided fine needle aspiration
should be considered, as per the consensus statement: Management of
Thyroid Nodules Detected at US:  [employer] in
800.

## 2014-04-18 DIAGNOSIS — M79609 Pain in unspecified limb: Secondary | ICD-10-CM | POA: Diagnosis not present

## 2014-05-15 DIAGNOSIS — E041 Nontoxic single thyroid nodule: Secondary | ICD-10-CM | POA: Diagnosis not present

## 2014-05-15 DIAGNOSIS — Z23 Encounter for immunization: Secondary | ICD-10-CM | POA: Diagnosis not present

## 2014-05-15 DIAGNOSIS — F411 Generalized anxiety disorder: Secondary | ICD-10-CM | POA: Diagnosis not present

## 2014-05-15 DIAGNOSIS — G479 Sleep disorder, unspecified: Secondary | ICD-10-CM | POA: Diagnosis not present

## 2014-05-15 DIAGNOSIS — R7309 Other abnormal glucose: Secondary | ICD-10-CM | POA: Diagnosis not present

## 2014-05-15 DIAGNOSIS — I10 Essential (primary) hypertension: Secondary | ICD-10-CM | POA: Diagnosis not present

## 2014-05-15 DIAGNOSIS — E669 Obesity, unspecified: Secondary | ICD-10-CM | POA: Diagnosis not present

## 2014-05-15 DIAGNOSIS — E782 Mixed hyperlipidemia: Secondary | ICD-10-CM | POA: Diagnosis not present

## 2014-06-08 ENCOUNTER — Other Ambulatory Visit (INDEPENDENT_AMBULATORY_CARE_PROVIDER_SITE_OTHER): Payer: Self-pay

## 2014-06-08 ENCOUNTER — Telehealth (INDEPENDENT_AMBULATORY_CARE_PROVIDER_SITE_OTHER): Payer: Self-pay | Admitting: General Surgery

## 2014-06-08 DIAGNOSIS — L821 Other seborrheic keratosis: Secondary | ICD-10-CM | POA: Diagnosis not present

## 2014-06-08 DIAGNOSIS — B079 Viral wart, unspecified: Secondary | ICD-10-CM | POA: Diagnosis not present

## 2014-06-08 DIAGNOSIS — E042 Nontoxic multinodular goiter: Secondary | ICD-10-CM

## 2014-06-08 DIAGNOSIS — D230 Other benign neoplasm of skin of lip: Secondary | ICD-10-CM | POA: Diagnosis not present

## 2014-06-08 DIAGNOSIS — D235 Other benign neoplasm of skin of trunk: Secondary | ICD-10-CM | POA: Diagnosis not present

## 2014-06-08 NOTE — Telephone Encounter (Signed)
Pt called for [place] and TSH prior to [facility] thyroid appt on 07/18/14.

## 2014-06-09 ENCOUNTER — Telehealth (INDEPENDENT_AMBULATORY_CARE_PROVIDER_SITE_OTHER): Payer: Self-pay | Admitting: *Deleted

## 2014-06-09 NOTE — Telephone Encounter (Signed)
Informed pt of message below. Pt verbalized understanding  

## 2014-06-09 NOTE — Telephone Encounter (Signed)
LM for pt regarding her [place] that Dr. Harlow Asa wants her to get.  Advise pt it is scheduled for June 12, 2014 arriving at 12:15 p.m. At [facility], [address]  Thanks!  Anderson Malta

## 2014-06-12 ENCOUNTER — Ambulatory Visit
Admission: RE | Admit: 2014-06-12 | Discharge: 2014-06-12 | Disposition: A | Discharge status: Home / Self Care | Payer: Medicare Other | Source: Ambulatory Visit | Attending: Surgery | Admitting: Surgery

## 2014-06-12 DIAGNOSIS — E041 Nontoxic single thyroid nodule: Secondary | ICD-10-CM | POA: Diagnosis not present

## 2014-06-12 DIAGNOSIS — E042 Nontoxic multinodular goiter: Secondary | ICD-10-CM

## 2014-06-27 DIAGNOSIS — E785 Hyperlipidemia, unspecified: Secondary | ICD-10-CM | POA: Diagnosis not present

## 2014-06-27 DIAGNOSIS — R7309 Other abnormal glucose: Secondary | ICD-10-CM | POA: Diagnosis not present

## 2014-06-27 DIAGNOSIS — K602 Anal fissure, unspecified: Secondary | ICD-10-CM | POA: Diagnosis not present

## 2014-06-27 DIAGNOSIS — I10 Essential (primary) hypertension: Secondary | ICD-10-CM | POA: Diagnosis not present

## 2014-07-11 DIAGNOSIS — E042 Nontoxic multinodular goiter: Secondary | ICD-10-CM | POA: Diagnosis not present

## 2014-07-12 LAB — TSH: TSH: 2.661 u[IU]/mL (ref 0.350–4.500)

## 2014-07-18 ENCOUNTER — Ambulatory Visit (INDEPENDENT_AMBULATORY_CARE_PROVIDER_SITE_OTHER): Payer: 59 | Admitting: Surgery

## 2014-08-18 IMAGING — US US SOFT TISSUE HEAD/NECK
1 series · 14 of 25 positions shown · non-contrast
Comparison: Ultrasound of the thyroid of 11/25/2012

CLINICAL DATA: Follow up of thyroid nodule

THYROID ULTRASOUND
TECHNIQUE: Ultrasound examination of the thyroid gland and adjacent
soft tissues was performed.

[Series 1: us soft tissue head/neck · 0.08mm/px · 14 of 48 slices shown]
[im 1/48]
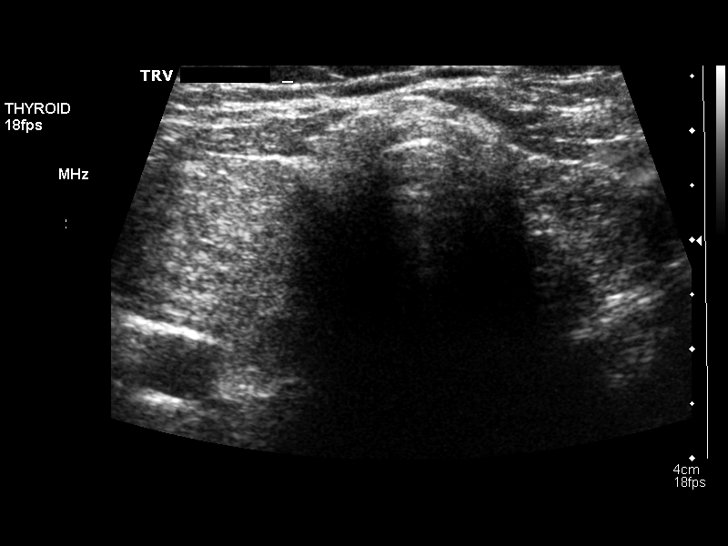
[im 4/48]
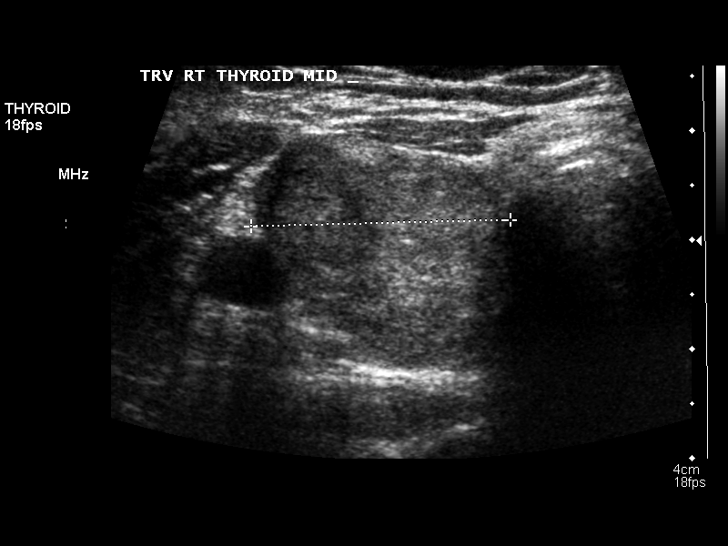
[im 8/48]
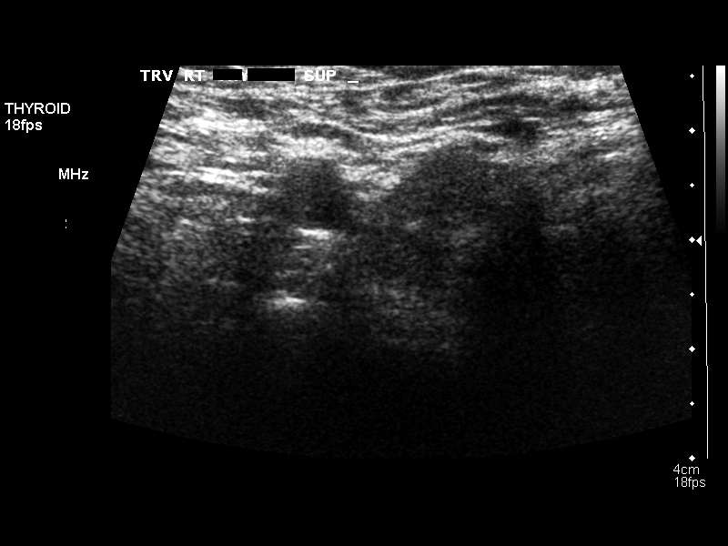
[im 12/48]
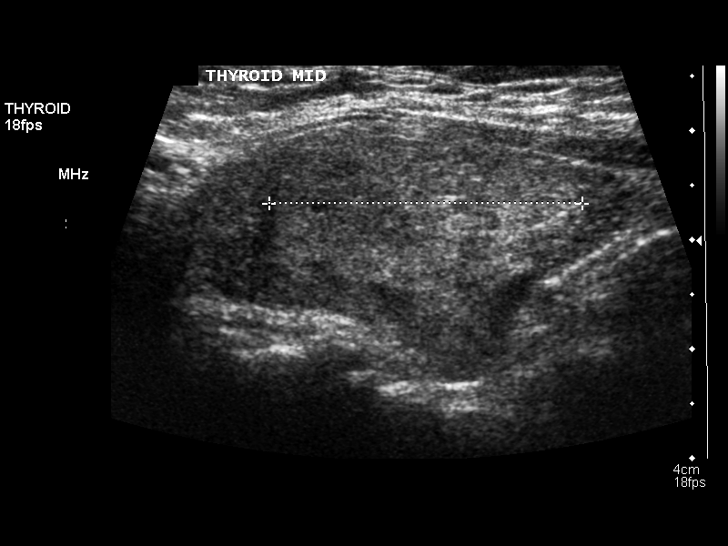
[im 16/48]
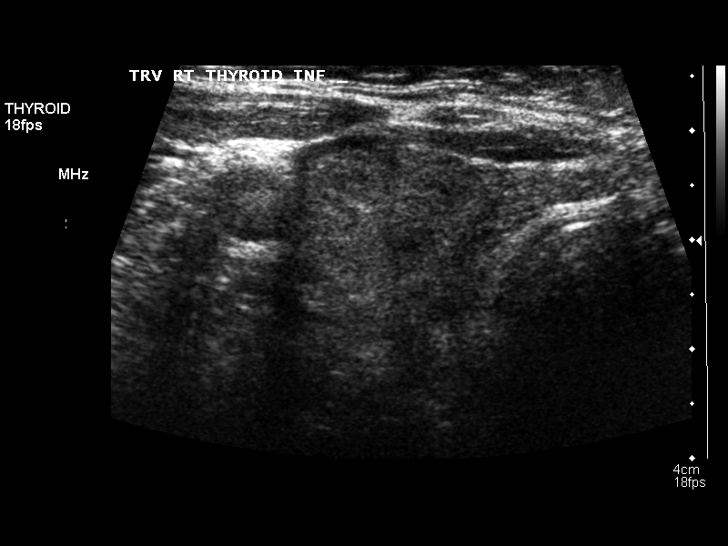
[im 18/48]
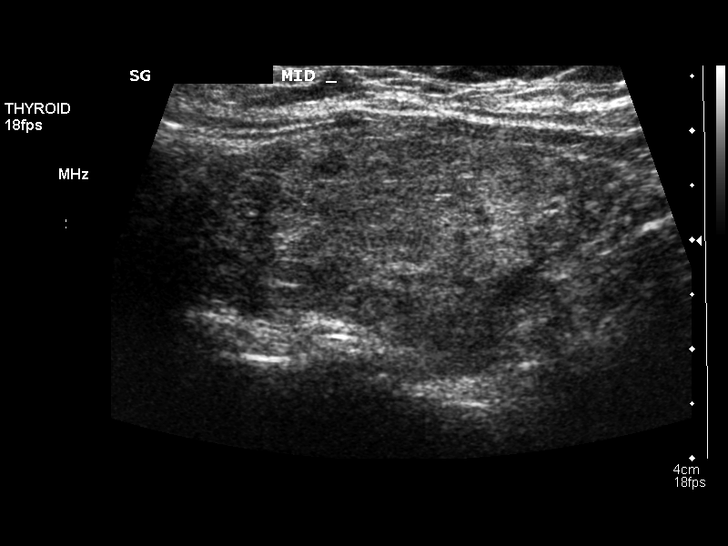
[im 22/48]
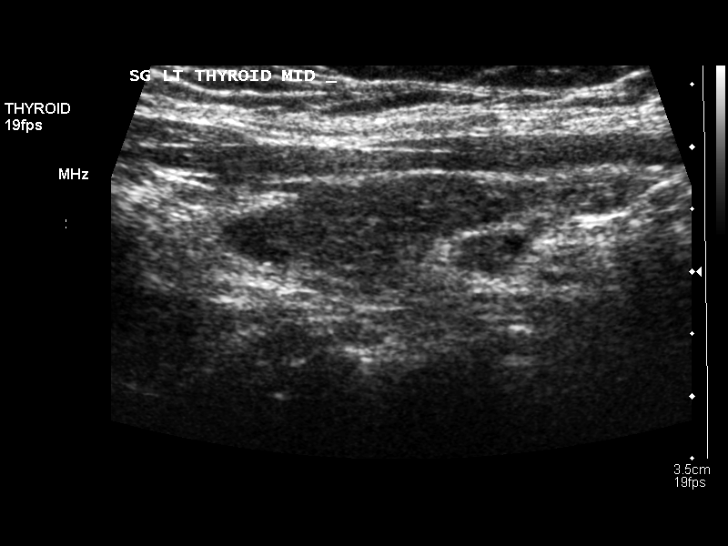
[im 26/48]
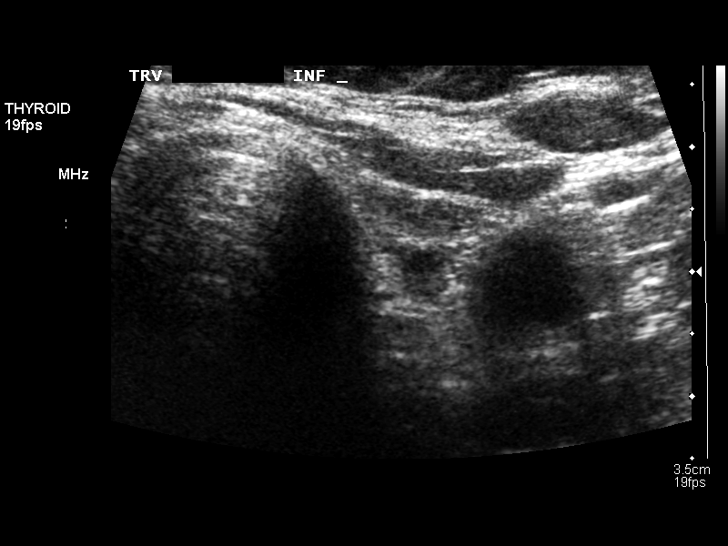
[im 30/48]
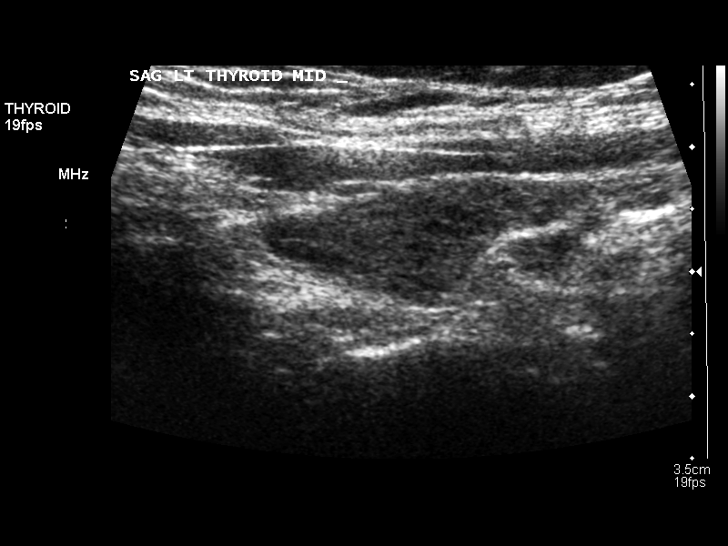
[im 32/48]
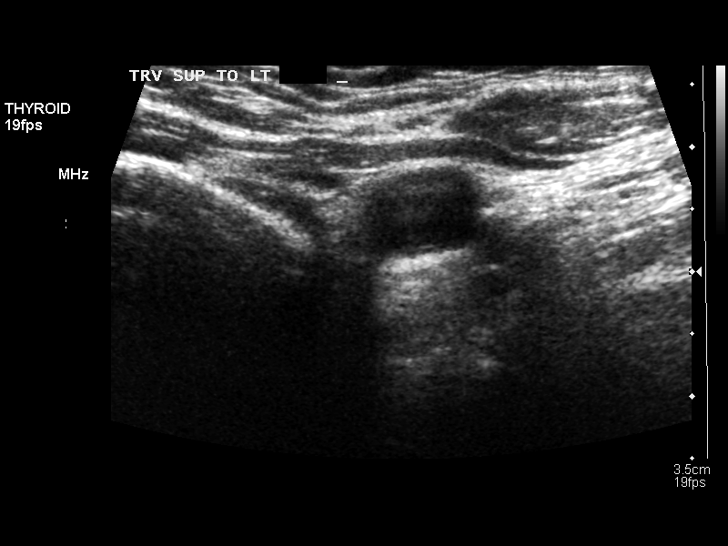
[im 36/48]
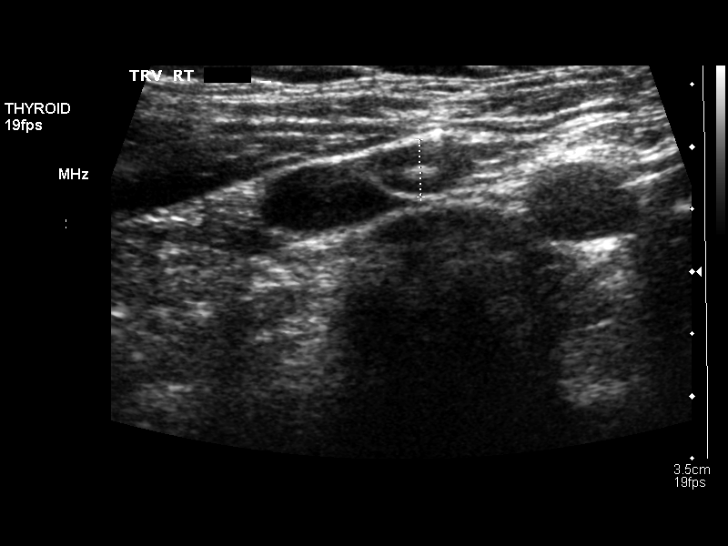
[im 40/48]
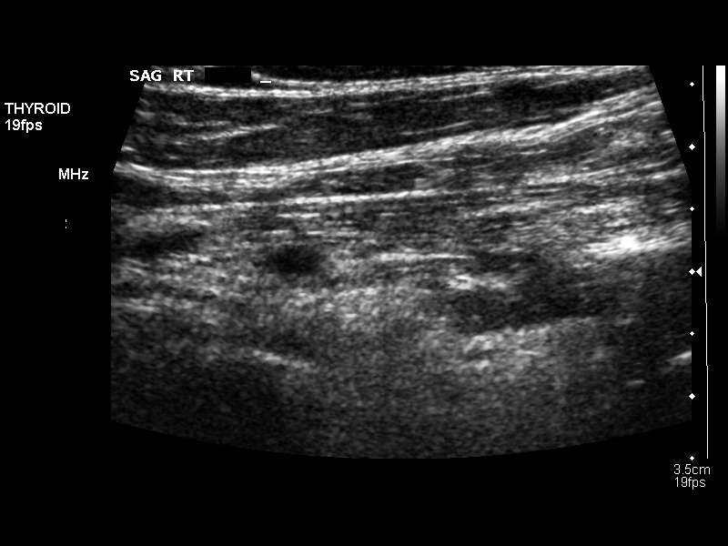
[im 44/48]
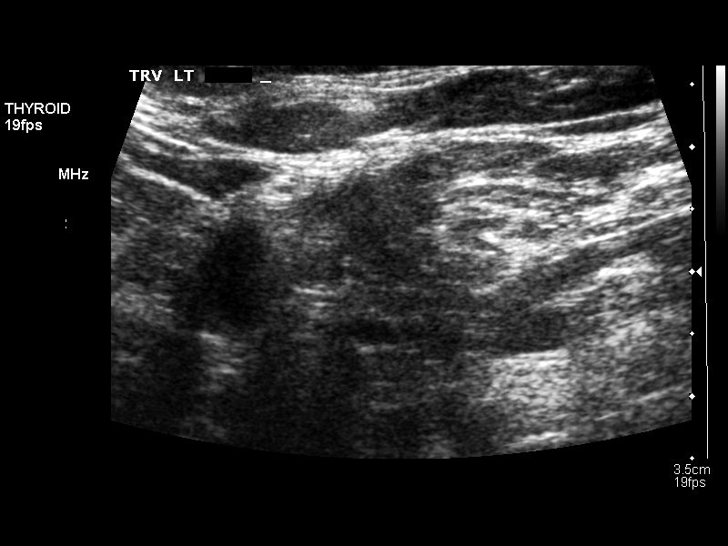
[im 48/48]
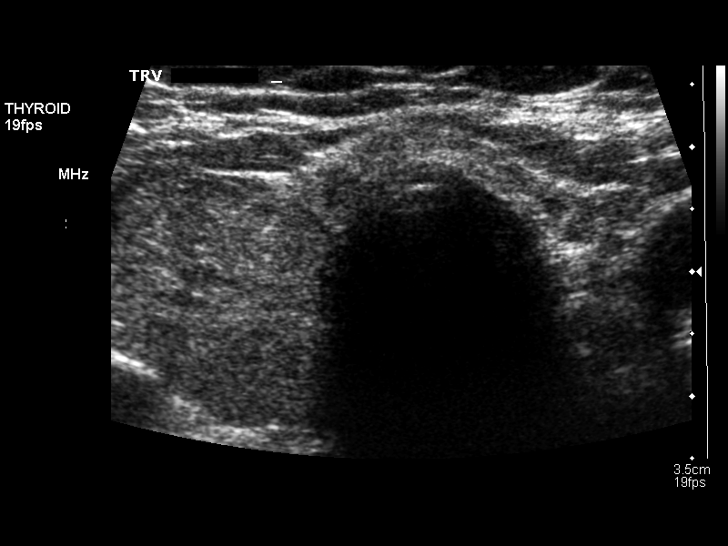

[14 of 25 positions shown; findings below may reference images not displayed]

FINDINGS: Right thyroid lobe:  4.4 x 2.4 x 2.4 cm.  (Previously 4.2 x 2.6 x
2.4 cm).
Left thyroid lobe:  3.8 x 1.1 x 1.3 cm.  (Previously 3.3 x 0.9 x
1.2 cm).
Isthmus:  3.5 mm in thickness.

Focal nodules:  The echogenicity of the thyroid parenchyma is
inhomogeneous.  As noted previously much of the right lobe of
thyroid is occupied by a single dominant nodule of 2.9 x 2.0 x
cm compared to prior measurements of 3.0 x 1.9 x 2.4 cm.  This
nodule was biopsied in November 2012.  No new or enlarging nodule
is seen.

Lymphadenopathy:  None
IMPRESSION: Stable dominant solid nodule in the right lobe of thyroid was
previously has been biopsied.

## 2014-08-22 ENCOUNTER — Ambulatory Visit (INDEPENDENT_AMBULATORY_CARE_PROVIDER_SITE_OTHER): Payer: Medicare Other | Admitting: Surgery

## 2014-08-22 ENCOUNTER — Encounter (INDEPENDENT_AMBULATORY_CARE_PROVIDER_SITE_OTHER): Payer: Self-pay | Admitting: Surgery

## 2014-08-22 VITALS — BP 122/82 | HR 74 | Temp 98.0°F | Ht 63.5 in | Wt 181.4 lb

## 2014-08-22 DIAGNOSIS — D440 Neoplasm of uncertain behavior of thyroid gland: Secondary | ICD-10-CM

## 2014-08-22 DIAGNOSIS — D449 Neoplasm of uncertain behavior of unspecified endocrine gland: Secondary | ICD-10-CM | POA: Diagnosis not present

## 2014-08-22 NOTE — Progress Notes (Signed)
General Surgery [facility]  Chief Complaint  Patient presents with  . Follow-up    right thyroid nodule    HISTORY: Patient is a 68 year old female followed for dominant right thyroid nodule. Originally diagnosed in 2013, she underwent fine-needle aspiration biopsy which showed a follicular lesion of undetermined significance with micro-follicles. She has been followed with sequential ultrasound scanning. There has been progressive enlargement of the nodule now measuring 3.3 x 2.3 x 2.3 cm. It does appear to be solitary. Thyroid function is normal with a TSH level of 2.66. Patient has never been on thyroid medication.  Patient does note the presence of the nodule. She feels as though it has increased in size. She denies any new masses or discomfort.  PERTINENT REVIEW OF SYSTEMS: Denies tremor. Denies palpitation. No new masses. No compressive symptoms.  EXAM: HEENT: normocephalic; pupils equal and reactive; sclerae clear; dentition good; mucous membranes moist NECK:  Dominant nodule was visible in the right thyroid lobe; on palpation this measures approximately 2-3 cm in size, is smooth, mobile, and nontender; left lobe without palpable abnormality; asymmetric on extension; no palpable anterior or posterior cervical lymphadenopathy; no supraclavicular masses; no tenderness CHEST: clear to auscultation bilaterally without rales, rhonchi, or wheezes CARDIAC: regular rate and rhythm without significant murmur; peripheral pulses are full EXT:  non-tender without edema; no deformity NEURO: no gross focal deficits; no sign of tremor   IMPRESSION: Right thyroid nodule, 3.3 cm, interval enlargement, follicular lesion of undetermined significance  PLAN: The patient and I discussed the above findings. I offered surgical resection versus repeat fine-needle aspiration biopsy. After consideration, the patient would like to proceed with right thyroid lobectomy for definitive  diagnosis. We have discussed the risk and benefits of the procedure including the potential for recurrent laryngeal nerve injury and injury to parathyroid glands. We have discussed the possibility of completion thyroidectomy in the event of malignancy. We have discussed the potential need for thyroid hormone supplementation following surgery. We discussed the hospital stay and the cosmetic results to be anticipated. Patient understands and wishes to proceed with surgery in the near future.  The risks and benefits of the procedure have been discussed at length with the patient.  The patient understands the proposed procedure, potential alternative treatments, and the course of recovery to be expected.  All of the patient's questions have been answered at this time.  The patient wishes to proceed with surgery.  Earnstine Regal, MD, [facility] Office: [phone]  Visit Diagnoses: 1. Neoplasm of uncertain behavior of thyroid gland, right

## 2014-08-22 NOTE — Patient Instructions (Signed)

## 2014-08-31 ENCOUNTER — Encounter (HOSPITAL_COMMUNITY): Payer: Self-pay | Admitting: Pharmacy Technician

## 2014-09-01 ENCOUNTER — Encounter (HOSPITAL_COMMUNITY): Payer: Self-pay | Admitting: *Deleted

## 2014-09-04 MED ORDER — CEFAZOLIN SODIUM-DEXTROSE 2-3 GM-% IV SOLR
2.0000 g | INTRAVENOUS | Status: DC
  Filled 2014-09-04: qty 50

## 2014-09-05 ENCOUNTER — Ambulatory Visit (HOSPITAL_COMMUNITY): Payer: Medicare Other

## 2014-09-05 ENCOUNTER — Encounter (HOSPITAL_COMMUNITY): Payer: Self-pay | Admitting: *Deleted

## 2014-09-05 ENCOUNTER — Observation Stay (HOSPITAL_COMMUNITY)
Admission: RE | Admit: 2014-09-05 | Discharge: 2014-09-06 | Disposition: A | Discharge status: Home / Self Care | Payer: Medicare Other | Source: Ambulatory Visit | Attending: Surgery | Admitting: Surgery

## 2014-09-05 ENCOUNTER — Encounter (HOSPITAL_COMMUNITY)
Admission: RE | Disposition: A | Discharge status: Home / Self Care | Payer: Self-pay | Source: Ambulatory Visit | Attending: Surgery

## 2014-09-05 ENCOUNTER — Ambulatory Visit (HOSPITAL_COMMUNITY): Payer: Medicare Other | Admitting: Certified Registered Nurse Anesthetist

## 2014-09-05 ENCOUNTER — Encounter (HOSPITAL_COMMUNITY): Payer: Medicare Other | Admitting: Certified Registered Nurse Anesthetist

## 2014-09-05 DIAGNOSIS — E041 Nontoxic single thyroid nodule: Secondary | ICD-10-CM | POA: Diagnosis present

## 2014-09-05 DIAGNOSIS — E063 Autoimmune thyroiditis: Secondary | ICD-10-CM | POA: Diagnosis not present

## 2014-09-05 DIAGNOSIS — Z01818 Encounter for other preprocedural examination: Secondary | ICD-10-CM | POA: Diagnosis not present

## 2014-09-05 DIAGNOSIS — Z79899 Other long term (current) drug therapy: Secondary | ICD-10-CM | POA: Diagnosis not present

## 2014-09-05 DIAGNOSIS — K219 Gastro-esophageal reflux disease without esophagitis: Secondary | ICD-10-CM | POA: Insufficient documentation

## 2014-09-05 DIAGNOSIS — D440 Neoplasm of uncertain behavior of thyroid gland: Secondary | ICD-10-CM | POA: Diagnosis present

## 2014-09-05 DIAGNOSIS — D34 Benign neoplasm of thyroid gland: Principal | ICD-10-CM | POA: Insufficient documentation

## 2014-09-05 DIAGNOSIS — I10 Essential (primary) hypertension: Secondary | ICD-10-CM | POA: Diagnosis not present

## 2014-09-05 HISTORY — DX: Gastro-esophageal reflux disease without esophagitis: K21.9

## 2014-09-05 HISTORY — DX: Irritable bowel syndrome, unspecified: K58.9

## 2014-09-05 HISTORY — DX: Personal history of other diseases of the digestive system: Z87.19

## 2014-09-05 HISTORY — DX: Other specified postprocedural states: R11.2

## 2014-09-05 HISTORY — DX: Other specified postprocedural states: Z98.890

## 2014-09-05 HISTORY — DX: Anemia, unspecified: D64.9

## 2014-09-05 HISTORY — PX: THYROID LOBECTOMY: SHX420

## 2014-09-05 HISTORY — DX: Unspecified osteoarthritis, unspecified site: M19.90

## 2014-09-05 LAB — BASIC METABOLIC PANEL
ANION GAP: 14 (ref 5–15)
BUN: 10 mg/dL (ref 6–23)
CALCIUM: 9.6 mg/dL (ref 8.4–10.5)
CO2: 28 mEq/L (ref 19–32)
Chloride: 100 mEq/L (ref 96–112)
Creatinine, Ser: 0.79 mg/dL (ref 0.50–1.10)
GFR calc Af Amer: >90 mL/min (ref >90)
GFR, EST NON AFRICAN AMERICAN: 84 mL/min — AB (ref >90)
Glucose, Bld: 112 mg/dL — ABNORMAL HIGH (ref 70–99)
Potassium: 3.4 mEq/L — ABNORMAL LOW (ref 3.7–5.3)
SODIUM: 142 meq/L (ref 137–147)

## 2014-09-05 LAB — CBC
HCT: 40.4 % (ref 36.0–46.0)
Hemoglobin: 14.1 g/dL (ref 12.0–15.0)
MCH: 31.1 pg (ref 26.0–34.0)
MCHC: 34.9 g/dL (ref 30.0–36.0)
MCV: 89.2 fL (ref 78.0–100.0)
Platelets: 236 10*3/uL (ref 150–400)
RBC: 4.53 MIL/uL (ref 3.87–5.11)
RDW: 12.7 % (ref 11.5–15.5)
WBC: 8.8 10*3/uL (ref 4.0–10.5)

## 2014-09-05 SURGERY — LOBECTOMY, THYROID
Anesthesia: General | Laterality: Right

## 2014-09-05 MED ORDER — ROCURONIUM BROMIDE 100 MG/10ML IV SOLN
INTRAVENOUS | Status: DC | PRN
  Administered 2014-09-05: 40 mg via INTRAVENOUS

## 2014-09-05 MED ORDER — AMLODIPINE BESYLATE 5 MG PO TABS
5.0000 mg | ORAL_TABLET | Freq: Every day | ORAL | Status: DC
  Administered 2014-09-06: 5 mg via ORAL
  Filled 2014-09-05: qty 1

## 2014-09-05 MED ORDER — DEXAMETHASONE SODIUM PHOSPHATE 4 MG/ML IJ SOLN
INTRAMUSCULAR | Status: DC | PRN
  Administered 2014-09-05: 4 mg via INTRAVENOUS

## 2014-09-05 MED ORDER — DEXAMETHASONE SODIUM PHOSPHATE 4 MG/ML IJ SOLN
INTRAMUSCULAR | Status: AC
  Filled 2014-09-05: qty 1

## 2014-09-05 MED ORDER — GLYCOPYRROLATE 0.2 MG/ML IJ SOLN
INTRAMUSCULAR | Status: DC | PRN
  Administered 2014-09-05: 0.6 mg via INTRAVENOUS

## 2014-09-05 MED ORDER — SCOPOLAMINE 1 MG/3DAYS TD PT72
MEDICATED_PATCH | TRANSDERMAL | Status: DC | PRN
  Administered 2014-09-05: 1 via TRANSDERMAL

## 2014-09-05 MED ORDER — GLYCOPYRROLATE 0.2 MG/ML IJ SOLN
INTRAMUSCULAR | Status: AC
Start: 2014-09-05 — End: 2014-09-05
  Filled 2014-09-05: qty 1

## 2014-09-05 MED ORDER — MIDAZOLAM HCL 5 MG/5ML IJ SOLN
INTRAMUSCULAR | Status: DC | PRN
  Administered 2014-09-05: 2 mg via INTRAVENOUS

## 2014-09-05 MED ORDER — NEOSTIGMINE METHYLSULFATE 10 MG/10ML IV SOLN
INTRAVENOUS | Status: AC
  Filled 2014-09-05: qty 1

## 2014-09-05 MED ORDER — ONDANSETRON HCL 4 MG PO TABS
4.0000 mg | ORAL_TABLET | Freq: Four times a day (QID) | ORAL | Status: DC | PRN
Start: 2014-09-05 — End: 2014-09-06

## 2014-09-05 MED ORDER — PROPOFOL 10 MG/ML IV BOLUS
INTRAVENOUS | Status: AC
  Filled 2014-09-05: qty 20

## 2014-09-05 MED ORDER — ROCURONIUM BROMIDE 50 MG/5ML IV SOLN
INTRAVENOUS | Status: AC
  Filled 2014-09-05: qty 1

## 2014-09-05 MED ORDER — LIDOCAINE HCL (CARDIAC) 20 MG/ML IV SOLN
INTRAVENOUS | Status: AC
  Filled 2014-09-05: qty 10

## 2014-09-05 MED ORDER — LOSARTAN POTASSIUM 50 MG PO TABS
100.0000 mg | ORAL_TABLET | Freq: Every day | ORAL | Status: DC
  Administered 2014-09-06: 100 mg via ORAL
  Filled 2014-09-05: qty 2

## 2014-09-05 MED ORDER — EPHEDRINE SULFATE 50 MG/ML IJ SOLN
INTRAMUSCULAR | Status: AC
  Filled 2014-09-05: qty 1

## 2014-09-05 MED ORDER — FENTANYL CITRATE 0.05 MG/ML IJ SOLN
INTRAMUSCULAR | Status: AC
  Filled 2014-09-05: qty 2

## 2014-09-05 MED ORDER — HYDROCHLOROTHIAZIDE 25 MG PO TABS
25.0000 mg | ORAL_TABLET | Freq: Every day | ORAL | Status: DC
  Administered 2014-09-06: 25 mg via ORAL
  Filled 2014-09-05: qty 1

## 2014-09-05 MED ORDER — NEOSTIGMINE METHYLSULFATE 10 MG/10ML IV SOLN
INTRAVENOUS | Status: DC | PRN
  Administered 2014-09-05: 4 mg via INTRAVENOUS

## 2014-09-05 MED ORDER — DEXTROSE 5 % IV SOLN
10.0000 mg | INTRAVENOUS | Status: DC | PRN
  Administered 2014-09-05: 50 ug/min via INTRAVENOUS

## 2014-09-05 MED ORDER — FENTANYL CITRATE 0.05 MG/ML IJ SOLN
25.0000 ug | INTRAMUSCULAR | Status: DC | PRN
  Administered 2014-09-05 (×3): 50 ug via INTRAVENOUS

## 2014-09-05 MED ORDER — HEMOSTATIC AGENTS (NO CHARGE) OPTIME
TOPICAL | Status: DC | PRN
  Administered 2014-09-05: 1 via TOPICAL

## 2014-09-05 MED ORDER — HYDROMORPHONE HCL PF 1 MG/ML IJ SOLN
1.0000 mg | INTRAMUSCULAR | Status: DC | PRN
  Administered 2014-09-05 – 2014-09-06 (×4): 1 mg via INTRAVENOUS
  Filled 2014-09-05 (×4): qty 1

## 2014-09-05 MED ORDER — FENTANYL CITRATE 0.05 MG/ML IJ SOLN
INTRAMUSCULAR | Status: DC | PRN
  Administered 2014-09-05: 50 ug via INTRAVENOUS
  Administered 2014-09-05 (×2): 100 ug via INTRAVENOUS

## 2014-09-05 MED ORDER — KCL IN DEXTROSE-NACL 30-5-0.45 MEQ/L-%-% IV SOLN
INTRAVENOUS | Status: DC
  Administered 2014-09-05: 17:00:00 via INTRAVENOUS
  Filled 2014-09-05 (×3): qty 1000

## 2014-09-05 MED ORDER — PROPOFOL 10 MG/ML IV BOLUS
INTRAVENOUS | Status: DC | PRN
  Administered 2014-09-05: 180 mg via INTRAVENOUS

## 2014-09-05 MED ORDER — SODIUM CHLORIDE 0.9 % IJ SOLN
INTRAMUSCULAR | Status: AC
  Filled 2014-09-05: qty 10

## 2014-09-05 MED ORDER — FENTANYL CITRATE 0.05 MG/ML IJ SOLN
INTRAMUSCULAR | Status: AC
  Filled 2014-09-05: qty 5

## 2014-09-05 MED ORDER — MIDAZOLAM HCL 2 MG/2ML IJ SOLN
INTRAMUSCULAR | Status: AC
  Filled 2014-09-05: qty 2

## 2014-09-05 MED ORDER — LACTATED RINGERS IV SOLN
INTRAVENOUS | Status: DC
  Administered 2014-09-05: 11:00:00 via INTRAVENOUS

## 2014-09-05 MED ORDER — ONDANSETRON HCL 4 MG/2ML IJ SOLN: 4.0000 mg | Freq: Four times a day (QID) | INTRAMUSCULAR | Status: DC | PRN

## 2014-09-05 MED ORDER — ONDANSETRON HCL 4 MG/2ML IJ SOLN: 4.0000 mg | Freq: Once | INTRAMUSCULAR | Status: DC | PRN

## 2014-09-05 MED ORDER — GLYCOPYRROLATE 0.2 MG/ML IJ SOLN
INTRAMUSCULAR | Status: AC
  Filled 2014-09-05: qty 3

## 2014-09-05 MED ORDER — LOSARTAN POTASSIUM 50 MG PO TABS: 100.0000 mg | ORAL_TABLET | Freq: Every day | ORAL | Status: DC

## 2014-09-05 MED ORDER — SCOPOLAMINE 1 MG/3DAYS TD PT72
MEDICATED_PATCH | TRANSDERMAL | Status: AC
  Filled 2014-09-05: qty 1

## 2014-09-05 MED ORDER — ONDANSETRON HCL 4 MG/2ML IJ SOLN
INTRAMUSCULAR | Status: DC | PRN
  Administered 2014-09-05: 4 mg via INTRAVENOUS

## 2014-09-05 MED ORDER — KCL IN DEXTROSE-NACL 20-5-0.45 MEQ/L-%-% IV SOLN
INTRAVENOUS | Status: AC
  Administered 2014-09-05: 1000 mL
  Filled 2014-09-05: qty 1000

## 2014-09-05 MED ORDER — TRAMADOL HCL 50 MG PO TABS: 50.0000 mg | ORAL_TABLET | Freq: Four times a day (QID) | ORAL | Status: DC | PRN

## 2014-09-05 MED ORDER — LORAZEPAM 0.5 MG PO TABS
0.5000 mg | ORAL_TABLET | Freq: Three times a day (TID) | ORAL | Status: DC | PRN
  Administered 2014-09-05 (×2): 0.5 mg via ORAL
  Filled 2014-09-05 (×2): qty 1

## 2014-09-05 MED ORDER — WHITE PETROLATUM GEL
Status: AC
  Administered 2014-09-05: 0.2
  Filled 2014-09-05: qty 5

## 2014-09-05 MED ORDER — ACETAMINOPHEN 325 MG PO TABS: 650.0000 mg | ORAL_TABLET | ORAL | Status: DC | PRN

## 2014-09-05 MED ORDER — LIDOCAINE HCL (CARDIAC) 20 MG/ML IV SOLN
INTRAVENOUS | Status: DC | PRN
  Administered 2014-09-05: 20 mg via INTRAVENOUS

## 2014-09-05 MED ORDER — HYDROCODONE-ACETAMINOPHEN 5-325 MG PO TABS
1.0000 | ORAL_TABLET | ORAL | Status: DC | PRN
  Administered 2014-09-05 – 2014-09-06 (×2): 2 via ORAL
  Filled 2014-09-05 (×2): qty 2

## 2014-09-05 MED ORDER — LACTATED RINGERS IV SOLN
INTRAVENOUS | Status: DC | PRN
  Administered 2014-09-05: 12:00:00 via INTRAVENOUS

## 2014-09-05 MED ORDER — ONDANSETRON HCL 4 MG/2ML IJ SOLN
INTRAMUSCULAR | Status: AC
  Filled 2014-09-05: qty 2

## 2014-09-05 MED ORDER — 0.9 % SODIUM CHLORIDE (POUR BTL) OPTIME
TOPICAL | Status: DC | PRN
  Administered 2014-09-05: 1000 mL

## 2014-09-05 SURGICAL SUPPLY — 50 items
APL SKNCLS STERI-STRIP NONHPOA (GAUZE/BANDAGES/DRESSINGS) ×1
BENZOIN TINCTURE PRP APPL 2/3 (GAUZE/BANDAGES/DRESSINGS) ×2 IMPLANT
BLADE SURG ROTATE 9660 (MISCELLANEOUS) IMPLANT
CANISTER SUCTION 2500CC (MISCELLANEOUS) ×2 IMPLANT
CHLORAPREP W/TINT 26ML (MISCELLANEOUS) ×1 IMPLANT
CLIP TI MEDIUM 24 (CLIP) ×2 IMPLANT
CLIP TI WIDE RED SMALL 24 (CLIP) ×2 IMPLANT
CONT SPEC 4OZ CLIKSEAL STRL BL (MISCELLANEOUS) IMPLANT
COVER SURGICAL LIGHT HANDLE (MISCELLANEOUS) ×2 IMPLANT
CRADLE DONUT ADULT HEAD (MISCELLANEOUS) ×2 IMPLANT
DRAPE PED LAPAROTOMY (DRAPES) ×2 IMPLANT
DRAPE UTILITY 15X26 W/TAPE STR (DRAPE) ×4 IMPLANT
ELECT CAUTERY BLADE 6.4 (BLADE) ×2 IMPLANT
ELECT REM PT RETURN 9FT ADLT (ELECTROSURGICAL) ×2
ELECTRODE REM PT RTRN 9FT ADLT (ELECTROSURGICAL) ×1 IMPLANT
GAUZE SPONGE 4X4 12PLY STRL (GAUZE/BANDAGES/DRESSINGS) ×2 IMPLANT
GAUZE SPONGE 4X4 16PLY XRAY LF (GAUZE/BANDAGES/DRESSINGS) ×2 IMPLANT
GLOVE BIOGEL M STER SZ 6 (GLOVE) ×2 IMPLANT
GLOVE BIOGEL PI IND STRL 6.5 (GLOVE) IMPLANT
GLOVE BIOGEL PI IND STRL 7.5 (GLOVE) IMPLANT
GLOVE BIOGEL PI INDICATOR 6.5 (GLOVE) ×1
GLOVE BIOGEL PI INDICATOR 7.5 (GLOVE) ×1
GLOVE ECLIPSE 7.5 STRL STRAW (GLOVE) ×1 IMPLANT
GLOVE SURG ORTHO 8.0 STRL STRW (GLOVE) ×2 IMPLANT
GLOVE SURG SS PI 7.0 STRL IVOR (GLOVE) ×1 IMPLANT
GOWN STRL REUS W/ TWL LRG LVL3 (GOWN DISPOSABLE) ×2 IMPLANT
GOWN STRL REUS W/ TWL XL LVL3 (GOWN DISPOSABLE) ×1 IMPLANT
GOWN STRL REUS W/TWL LRG LVL3 (GOWN DISPOSABLE) ×4
GOWN STRL REUS W/TWL XL LVL3 (GOWN DISPOSABLE) ×6
HEMOSTAT SURGICEL 2X4 FIBR (HEMOSTASIS) ×2 IMPLANT
KIT BASIN OR (CUSTOM PROCEDURE TRAY) ×2 IMPLANT
KIT ROOM TURNOVER OR (KITS) ×2 IMPLANT
NS IRRIG 1000ML POUR BTL (IV SOLUTION) ×2 IMPLANT
PACK SURGICAL SETUP 50X90 (CUSTOM PROCEDURE TRAY) ×2 IMPLANT
PAD ARMBOARD 7.5X6 YLW CONV (MISCELLANEOUS) ×2 IMPLANT
PENCIL BUTTON HOLSTER BLD 10FT (ELECTRODE) ×2 IMPLANT
SHEARS HARMONIC 9CM CVD (BLADE) ×2 IMPLANT
SPECIMEN JAR MEDIUM (MISCELLANEOUS) IMPLANT
SPONGE GAUZE 4X4 12PLY STER LF (GAUZE/BANDAGES/DRESSINGS) ×1 IMPLANT
SPONGE INTESTINAL PEANUT (DISPOSABLE) ×2 IMPLANT
STRIP CLOSURE SKIN 1/2X4 (GAUZE/BANDAGES/DRESSINGS) ×2 IMPLANT
SUT MNCRL AB 4-0 PS2 18 (SUTURE) ×2 IMPLANT
SUT SILK 2 0 (SUTURE) ×2
SUT SILK 2-0 18XBRD TIE 12 (SUTURE) ×1 IMPLANT
SUT VIC AB 3-0 SH 18 (SUTURE) ×2 IMPLANT
SYR BULB 3OZ (MISCELLANEOUS) ×2 IMPLANT
TAPE CLOTH SURG 4X10 WHT LF (GAUZE/BANDAGES/DRESSINGS) ×1 IMPLANT
TOWEL OR 17X24 6PK STRL BLUE (TOWEL DISPOSABLE) ×2 IMPLANT
TOWEL OR 17X26 10 PK STRL BLUE (TOWEL DISPOSABLE) ×2 IMPLANT
TUBE CONNECTING 12X1/4 (SUCTIONS) ×2 IMPLANT

## 2014-09-05 NOTE — Anesthesia Preprocedure Evaluation (Signed)
Anesthesia Evaluation  Patient identified by MRN, date of birth, ID band Patient awake    Reviewed: Allergy & Precautions, H&P , NPO status   Airway Mallampati: II TM Distance: >3 FB Neck ROM: Full    Dental  (+) Teeth Intact, Dental Advisory Given   Pulmonary  breath sounds clear to auscultation        Cardiovascular hypertension, Rhythm:Regular Rate:Normal     Neuro/Psych    GI/Hepatic   Endo/Other    Renal/GU      Musculoskeletal   Abdominal   Peds  Hematology   Anesthesia Other Findings   Reproductive/Obstetrics                           Anesthesia Physical Anesthesia Plan  ASA: III  Anesthesia Plan: General   Post-op Pain Management:    Induction: Intravenous  Airway Management Planned: Oral ETT  Additional Equipment:   Intra-op Plan:   Post-operative Plan: Extubation in OR  Informed Consent: I have reviewed the patients History and Physical, chart, labs and discussed the procedure including the risks, benefits and alternatives for the proposed anesthesia with the patient or authorized representative who has indicated his/her understanding and acceptance.   Dental advisory given  Plan Discussed with: CRNA and Anesthesiologist  Anesthesia Plan Comments: (Thyrpoid nodule Hypertension H/O Post-op N/V GERD  Plan GA with oral ETT  Roberts Gaudy)        Anesthesia Quick Evaluation

## 2014-09-05 NOTE — Brief Op Note (Signed)
09/05/2014  1:36 PM  PATIENT:  Sheena Trevino  68 y.o. female  PRE-OPERATIVE DIAGNOSIS:  Right Thyroid Nodule  POST-OPERATIVE DIAGNOSIS:  Right Thyroid Nodule  PROCEDURE:  Procedure(s): RIGHT THYROID LOBECTOMY (Right)  SURGEON:  Surgeon(s) and Role:    * Armandina Gemma, MD - Primary  PHYSICIAN ASSISTANT:   ASSISTANTS: RNFA   ANESTHESIA:   general  EBL:  Total I/O In: 500 [I.V.:500] Out: -   BLOOD ADMINISTERED:none  DRAINS: none   LOCAL MEDICATIONS USED:  NONE  SPECIMEN:  Excision  DISPOSITION OF SPECIMEN:  PATHOLOGY  COUNTS:  YES  TOURNIQUET:  * No tourniquets in log *  DICTATION: .Other Dictation: Dictation Number [id number]  PLAN OF CARE: Admit for overnight observation  PATIENT DISPOSITION:  PACU - hemodynamically stable.   Delay start of Pharmacological VTE agent (>24hrs) due to surgical blood loss or risk of bleeding: yes  Earnstine Regal, MD, [facility] Office: [phone]

## 2014-09-05 NOTE — Progress Notes (Signed)
Care of pt assumed by MA Batsheva Stevick RN 

## 2014-09-05 NOTE — Anesthesia Procedure Notes (Signed)
Procedure Name: Intubation Date/Time: 09/05/2014 12:35 PM Performed by: Trixie Deis A Pre-anesthesia Checklist: Patient identified, Timeout performed, Emergency Drugs available, Suction available and Patient being monitored Patient Re-evaluated:Patient Re-evaluated prior to inductionOxygen Delivery Method: Circle system utilized Preoxygenation: Pre-oxygenation with 100% oxygen Intubation Type: IV induction Ventilation: Mask ventilation without difficulty Laryngoscope Size: Mac and 3 Grade View: Grade I Tube type: Oral Tube size: 7.0 mm Number of attempts: 1 Airway Equipment and Method: Stylet and LTA kit utilized Placement Confirmation: ETT inserted through vocal cords under direct vision,  breath sounds checked- equal and bilateral and positive ETCO2 Secured at: 21 cm Tube secured with: Tape Dental Injury: Teeth and Oropharynx as per pre-operative assessment

## 2014-09-05 NOTE — Transfer of Care (Signed)
Immediate Anesthesia Transfer of Care Note  Patient: Sheena Trevino  Procedure(s) Performed: Procedure(s): RIGHT THYROID LOBECTOMY (Right)  Patient Location: PACU  Anesthesia Type:General  Level of Consciousness: awake, alert  and oriented  Airway & Oxygen Therapy: Patient Spontanous Breathing and Patient connected to nasal cannula oxygen  Post-op Assessment: Report given to PACU RN, Post -op Vital signs reviewed and stable, Patient moving all extremities X 4 and vocalizes wel  Post vital signs: Reviewed and stable  Complications: No apparent anesthesia complications

## 2014-09-05 NOTE — Interval H&P Note (Signed)
History and Physical Interval Note:  09/05/2014 11:56 AM  Sheena Trevino  has presented today for surgery, with the diagnosis of Right Thyroid Nodule.  The various methods of treatment have been discussed with the patient and family. After consideration of risks, benefits and other options for treatment, the patient has consented to    Procedure(s): RIGHT THYROID LOBECTOMY (Right) as a surgical intervention .    The patient's history has been reviewed, patient examined, no change in status, stable for surgery.  I have reviewed the patient's chart and labs.  Questions were answered to the patient's satisfaction.    Earnstine Regal, MD, [facility] Office: [place]

## 2014-09-05 NOTE — Progress Notes (Signed)
Report given to maryann rn as caregiver 

## 2014-09-05 NOTE — Anesthesia Postprocedure Evaluation (Signed)
  Anesthesia Post-op Note  Patient: Sheena Trevino  Procedure(s) Performed: Procedure(s): RIGHT THYROID LOBECTOMY (Right)  Patient Location: PACU  Anesthesia Type:General  Level of Consciousness: awake, alert  and oriented  Airway and Oxygen Therapy: Patient Spontanous Breathing and Patient connected to nasal cannula oxygen  Post-op Pain: mild  Post-op Assessment: Post-op Vital signs reviewed, Patient's Cardiovascular Status Stable, Respiratory Function Stable, Patent Airway and Pain level controlled  Post-op Vital Signs: stable  Last Vitals:  Filed Vitals:   09/05/14 1605  BP: 147/73  Pulse: 73  Temp: 36.8 C  Resp: 16    Complications: No apparent anesthesia complications

## 2014-09-05 NOTE — H&P (View-Only) (Signed)
General Surgery [facility]  Chief Complaint  Patient presents with  . Follow-up    right thyroid nodule    HISTORY: Patient is a 68 year old female followed for dominant right thyroid nodule. Originally diagnosed in 2013, she underwent fine-needle aspiration biopsy which showed a follicular lesion of undetermined significance with micro-follicles. She has been followed with sequential ultrasound scanning. There has been progressive enlargement of the nodule now measuring 3.3 x 2.3 x 2.3 cm. It does appear to be solitary. Thyroid function is normal with a TSH level of 2.66. Patient has never been on thyroid medication.  Patient does note the presence of the nodule. She feels as though it has increased in size. She denies any new masses or discomfort.  PERTINENT REVIEW OF SYSTEMS: Denies tremor. Denies palpitation. No new masses. No compressive symptoms.  EXAM: HEENT: normocephalic; pupils equal and reactive; sclerae clear; dentition good; mucous membranes moist NECK:  Dominant nodule was visible in the right thyroid lobe; on palpation this measures approximately 2-3 cm in size, is smooth, mobile, and nontender; left lobe without palpable abnormality; asymmetric on extension; no palpable anterior or posterior cervical lymphadenopathy; no supraclavicular masses; no tenderness CHEST: clear to auscultation bilaterally without rales, rhonchi, or wheezes CARDIAC: regular rate and rhythm without significant murmur; peripheral pulses are full EXT:  non-tender without edema; no deformity NEURO: no gross focal deficits; no sign of tremor   IMPRESSION: Right thyroid nodule, 3.3 cm, interval enlargement, follicular lesion of undetermined significance  PLAN: The patient and I discussed the above findings. I offered surgical resection versus repeat fine-needle aspiration biopsy. After consideration, the patient would like to proceed with right thyroid lobectomy for definitive  diagnosis. We have discussed the risk and benefits of the procedure including the potential for recurrent laryngeal nerve injury and injury to parathyroid glands. We have discussed the possibility of completion thyroidectomy in the event of malignancy. We have discussed the potential need for thyroid hormone supplementation following surgery. We discussed the hospital stay and the cosmetic results to be anticipated. Patient understands and wishes to proceed with surgery in the near future.  The risks and benefits of the procedure have been discussed at length with the patient.  The patient understands the proposed procedure, potential alternative treatments, and the course of recovery to be expected.  All of the patient's questions have been answered at this time.  The patient wishes to proceed with surgery.  Earnstine Regal, MD, [facility] Office: [phone]  Visit Diagnoses: 1. Neoplasm of uncertain behavior of thyroid gland, right

## 2014-09-05 NOTE — Op Note (Signed)
Sheena Trevino, Sheena Trevino                  ACCOUNT NO.:  [id number]  MEDICAL RECORD NO.:  [id number]  LOCATION:  6N04C                        FACILITY:  [facility]  PHYSICIAN:  Earnstine Regal, MD      DATE OF BIRTH:  1946/10/07  DATE OF PROCEDURE:  09/05/2014                              OPERATIVE REPORT   PREOPERATIVE DIAGNOSIS:  Right thyroid nodule of undetermined significance, 3.3 cm (FLUS)  POSTOPERATIVE DIAGNOSIS:  same  PROCEDURE:  Right thyroid lobectomy  SURGEON:  Earnstine Regal, MD, FACS  ANESTHESIA:  General.  ESTIMATED BLOOD LOSS:  Minimal.  PREPARATION:  ChloraPrep.  COMPLICATIONS:  None.  INDICATIONS:  The patient is a 68 year old female followed since 2013 with right-sided thyroid nodules.  Fine-needle aspiration biopsy showed a follicular lesion of undetermined significance with micro-follicles. On sequential ultrasound scanning, there was progressive enlargement of the dominant nodule now measuring 3.3 cm.  She now comes to Surgery for right thyroid lobectomy for definitive diagnosis.  BODY OF REPORT:  Procedure was done in OR #10 at the [place]. [facility].  The patient was brought to the operating room, placed in the supine position on the operating room table.  Following administration of general anesthesia, the patient was positioned and then prepped and draped in the usual aseptic fashion.  After ascertaining that an adequate level of anesthesia had been obtained, a Kocher incision was made with a #10 blade.  Dissection was carried through the skin and subcutaneous tissues and hemostasis achieved with electrocautery.  Platysma was divided.  Subplatysmal flaps were elevated cephalad and caudad from the thyroid notch to the sternal notch.  A Mahorner self-retaining retractor was placed for exposure.  Strap muscles were incised in the midline and dissection was begun on the left.  Left thyroid lobe was exposed.  It was small and grossly normal. On  palpation, there were no nodules.  Next, we turned our attention to the right thyroid lobe.  Strap muscles were reflected laterally exposing the entire right thyroid lobe.  Middle thyroid vein was divided between Ligaclips with the Harmonic scalpel. Superior pole vessels were dissected out and divided individually between small and medium Ligaclips with the Harmonic scalpel.  Recurrent laryngeal nerve and parathyroid tissue were both identified and preserved.  Inferior venous tributaries were divided between Ligaclips with the Harmonic scalpel.  Branches of the inferior thyroid artery were divided between small Ligaclips with the Harmonic scalpel.  Gland was rolled anteriorly and mobilized up and onto the anterior trachea. Ligament of Gwenlyn Found was released with the electrocautery.  Isthmus was mobilized across the midline.  Isthmus was transected at the junction of the isthmus and the left thyroid lobe with the Harmonic scalpel.  A single suture was used to mark the superior pole.  A double suture was used to mark the isthmus.  The specimen was submitted in its entirety to Pathology for review.  Neck was irrigated with warm saline and good hemostasis was noted. Fibrillar was placed throughout the operative field.  Strap muscles were reapproximated in the midline with interrupted 3-0 Vicryl sutures. Platysma was closed with interrupted 3-0 Vicryl sutures.  Skin was closed with a  running 4-0 Monocryl subcuticular suture.  Wound was washed and dried, and benzoin and Steri-Strips were applied.  Sterile dressings were applied.  The patient was awakened from anesthesia and brought to the recovery room.  The patient tolerated the procedure well.   Earnstine Regal, MD, [facility] Office: [phone]   TMG/MEDQ  D:  09/05/2014  T:  09/05/2014  Job:  [id number]  cc:   Kathyrn Lass, M.D.

## 2014-09-06 DIAGNOSIS — D34 Benign neoplasm of thyroid gland: Secondary | ICD-10-CM | POA: Diagnosis not present

## 2014-09-06 MED ORDER — HYDROCODONE-ACETAMINOPHEN 5-325 MG PO TABS: 1.0000 | ORAL_TABLET | ORAL | Status: DC | PRN

## 2014-09-06 NOTE — Discharge Summary (Signed)
Physician Discharge Summary [facility]  Patient ID: Sheena Trevino MRN: [id number] DOB/AGE: 01/21/1946 68 y.o.  Admit date: 09/05/2014 Discharge date: 09/06/2014  Admission Diagnoses:  Right thyroid nodule, follicular  Discharge Diagnoses:  Principal Problem:   Neoplasm of uncertain behavior of thyroid gland, right Active Problems:   Right thyroid nodule   Discharged Condition: good  Hospital Course: Patient was admitted for observation following thyroid surgery.  Post op course was uncomplicated.  Pain was well controlled.  Tolerated diet.  Patient was prepared for discharge home on POD#1.  Consults: None  Treatments: surgery: right thyroid lobectomy  Discharge Exam: Blood pressure 155/74, pulse 78, temperature 98 F (36.7 C), temperature source Oral, resp. rate 15, height 5' 3.5" (1.613 m), weight 194 lb 1.6 oz (88.043 kg), SpO2 94.00%. HEENT - clear Neck - wound intact and dry, mild STS; voice normal Chest - clear bilaterally Cor - RRR  Disposition: Home  Discharge Instructions   Apply dressing    Complete by:  As directed   Apply light gauze dressing to wound before discharge home today.     Diet - low sodium heart healthy    Complete by:  As directed      Discharge instructions    Complete by:  As directed   THYROID & PARATHYROID SURGERY - POST OP INSTRUCTIONS  Always review your discharge instruction sheet from the facility where your surgery was performed.  A prescription for pain medication may be given to you upon discharge.  Take your pain medication as prescribed.  If narcotic pain medicine is not needed, then you may take acetaminophen (Tylenol) or ibuprofen (Advil) as needed.  Take your usually prescribed medications unless otherwise directed.  If you need a refill on your pain medication, please contact your pharmacy. They will contact our office to request authorization.  Prescriptions will not be processed after 5 pm or on  weekends.  Start with a light diet upon arrival home, such as soup and crackers or toast.  Be sure to drink plenty of fluids daily.  Resume your normal diet the day after surgery.  Most patients will experience some swelling and bruising on the chest and neck area.  Ice packs will help.  Swelling and bruising can take several days to resolve.   It is common to experience some constipation if taking pain medication after surgery.  Increasing fluid intake and taking a stool softener will usually help or prevent this problem.  A mild laxative (Milk of Magnesia or Miralax) should be taken according to package directions if there are no bowel movements after 48 hours.  You may remove your bandages 24-48 hours after surgery, and you may shower at that time.  You have steri-strips (small skin tapes) in place directly over the incision.  These strips should be left on the skin for 7-10 days and then removed.  You may resume regular (light) daily activities beginning the next day-such as daily self-care, walking, climbing stairs-gradually increasing activities as tolerated.  You may have sexual intercourse when it is comfortable.  Refrain from any heavy lifting or straining until approved by your doctor.  You may drive when you no longer are taking prescription pain medication, you can comfortably wear a seatbelt, and you can safely maneuver your car and apply brakes.  You should see your doctor in the office for a follow-up appointment approximately two to three weeks after your surgery.  Make sure that you call for this appointment within  a day or two after you arrive home to insure a convenient appointment time.  WHEN TO CALL YOUR DOCTOR: -- Fever greater than 101.5 -- Inability to urinate -- Nausea and/or vomiting - persistent -- Extreme swelling or bruising -- Continued bleeding from incision -- Increased pain, redness, or drainage from the incision -- Difficulty swallowing or breathing -- Muscle  cramping or spasms -- Numbness or tingling in hands or around lips  The clinic staff is available to answer your questions during regular business hours.  Please don't hesitate to call and ask to speak to one of the nurses if you have concerns.  Earnstine Regal, MD, [facility] Office: [phone]     Increase activity slowly    Complete by:  As directed      Remove dressing in 24 hours    Complete by:  As directed             Medication List         amLODipine 5 MG tablet  Commonly known as:  NORVASC  Take 5 mg by mouth daily.     cetirizine 10 MG tablet  Commonly known as:  ZYRTEC  Take 10 mg by mouth daily.     glucosamine-chondroitin 500-400 MG tablet  Take 1 tablet by mouth 2 (two) times daily.     hydrochlorothiazide 25 MG tablet  Commonly known as:  HYDRODIURIL  Take 25 mg by mouth daily.     HYDROcodone-acetaminophen 5-325 MG per tablet  Commonly known as:  NORCO/VICODIN  Take 1-2 tablets by mouth every 4 (four) hours as needed for moderate pain.     LORazepam 0.5 MG tablet  Commonly known as:  ATIVAN  Take 0.5 mg by mouth every 8 (eight) hours as needed for anxiety.     losartan 100 MG tablet  Commonly known as:  COZAAR  Take 100 mg by mouth daily.     multivitamin with minerals tablet  Take 1 tablet by mouth daily.     PROBIOTIC DAILY PO  Take 1 capsule by mouth daily.     simvastatin 20 MG tablet  Commonly known as:  ZOCOR  Take 20 mg by mouth 2 (two) times a week.     vitamin C 500 MG tablet  Commonly known as:  ASCORBIC ACID  Take 500 mg by mouth daily.     zolpidem 12.5 MG CR tablet  Commonly known as:  AMBIEN CR  Take 12.5 mg by mouth at bedtime as needed for sleep.           Follow-up Information   Follow up with Earnstine Regal, MD. Schedule an appointment as soon as possible for a visit in 3 weeks. (For wound re-check)    Specialty:  General Surgery   Contact information:    [address] [place] [address] [phone]       Earnstine Regal, MD, [facility] Office: [phone]   Signed: Earnstine Regal 09/06/2014, 8:27 AM

## 2014-09-06 NOTE — Progress Notes (Signed)
Discharge home. Home discharge instruction given, no questions verbalized. 

## 2014-09-07 ENCOUNTER — Encounter (HOSPITAL_COMMUNITY): Payer: Self-pay | Admitting: Surgery

## 2014-09-07 NOTE — Progress Notes (Signed)
Quick Note:  Please contact patient and notify of benign pathology results.  Alitzel Cookson M. Sharie Amorin, MD, FACS [facility] Office: [phone]   ______ 

## 2014-09-28 ENCOUNTER — Other Ambulatory Visit (INDEPENDENT_AMBULATORY_CARE_PROVIDER_SITE_OTHER): Payer: Self-pay

## 2014-09-28 DIAGNOSIS — E890 Postprocedural hypothyroidism: Secondary | ICD-10-CM

## 2014-10-03 ENCOUNTER — Other Ambulatory Visit: Payer: Self-pay | Admitting: Gastroenterology

## 2014-10-03 DIAGNOSIS — D122 Benign neoplasm of ascending colon: Secondary | ICD-10-CM | POA: Diagnosis not present

## 2014-10-03 DIAGNOSIS — D120 Benign neoplasm of cecum: Secondary | ICD-10-CM | POA: Diagnosis not present

## 2014-10-03 DIAGNOSIS — K648 Other hemorrhoids: Secondary | ICD-10-CM | POA: Diagnosis not present

## 2014-10-03 DIAGNOSIS — K6389 Other specified diseases of intestine: Secondary | ICD-10-CM | POA: Diagnosis not present

## 2014-10-03 DIAGNOSIS — Z1211 Encounter for screening for malignant neoplasm of colon: Secondary | ICD-10-CM | POA: Diagnosis not present

## 2014-10-19 DIAGNOSIS — E890 Postprocedural hypothyroidism: Secondary | ICD-10-CM | POA: Diagnosis not present

## 2014-10-31 ENCOUNTER — Other Ambulatory Visit: Payer: Self-pay

## 2014-10-31 DIAGNOSIS — Z1231 Encounter for screening mammogram for malignant neoplasm of breast: Secondary | ICD-10-CM

## 2014-11-03 ENCOUNTER — Ambulatory Visit
Admission: RE | Admit: 2014-11-03 | Discharge: 2014-11-03 | Disposition: A | Discharge status: Home / Self Care | Payer: Medicare Other | Source: Ambulatory Visit

## 2014-11-03 DIAGNOSIS — Z1231 Encounter for screening mammogram for malignant neoplasm of breast: Secondary | ICD-10-CM

## 2014-11-14 DIAGNOSIS — E785 Hyperlipidemia, unspecified: Secondary | ICD-10-CM | POA: Diagnosis not present

## 2014-11-14 DIAGNOSIS — E041 Nontoxic single thyroid nodule: Secondary | ICD-10-CM | POA: Diagnosis not present

## 2014-11-14 DIAGNOSIS — E669 Obesity, unspecified: Secondary | ICD-10-CM | POA: Diagnosis not present

## 2014-11-14 DIAGNOSIS — F411 Generalized anxiety disorder: Secondary | ICD-10-CM | POA: Diagnosis not present

## 2014-11-14 DIAGNOSIS — G4700 Insomnia, unspecified: Secondary | ICD-10-CM | POA: Diagnosis not present

## 2014-11-14 DIAGNOSIS — K589 Irritable bowel syndrome without diarrhea: Secondary | ICD-10-CM | POA: Diagnosis not present

## 2014-11-14 DIAGNOSIS — I10 Essential (primary) hypertension: Secondary | ICD-10-CM | POA: Diagnosis not present

## 2014-11-14 DIAGNOSIS — R7309 Other abnormal glucose: Secondary | ICD-10-CM | POA: Diagnosis not present

## 2014-12-11 DIAGNOSIS — K219 Gastro-esophageal reflux disease without esophagitis: Secondary | ICD-10-CM | POA: Diagnosis not present

## 2014-12-11 DIAGNOSIS — D120 Benign neoplasm of cecum: Secondary | ICD-10-CM | POA: Diagnosis not present

## 2014-12-11 DIAGNOSIS — Z86010 Personal history of colonic polyps: Secondary | ICD-10-CM | POA: Diagnosis not present

## 2014-12-11 DIAGNOSIS — Z882 Allergy status to sulfonamides status: Secondary | ICD-10-CM | POA: Diagnosis not present

## 2014-12-11 DIAGNOSIS — Z1211 Encounter for screening for malignant neoplasm of colon: Secondary | ICD-10-CM | POA: Diagnosis not present

## 2014-12-11 DIAGNOSIS — K6389 Other specified diseases of intestine: Secondary | ICD-10-CM | POA: Diagnosis not present

## 2014-12-11 DIAGNOSIS — E039 Hypothyroidism, unspecified: Secondary | ICD-10-CM | POA: Diagnosis not present

## 2014-12-11 DIAGNOSIS — I10 Essential (primary) hypertension: Secondary | ICD-10-CM | POA: Diagnosis not present

## 2014-12-11 DIAGNOSIS — Z885 Allergy status to narcotic agent status: Secondary | ICD-10-CM | POA: Diagnosis not present

## 2014-12-29 DIAGNOSIS — K635 Polyp of colon: Secondary | ICD-10-CM | POA: Insufficient documentation

## 2015-02-26 DIAGNOSIS — G4700 Insomnia, unspecified: Secondary | ICD-10-CM | POA: Diagnosis not present

## 2015-02-26 DIAGNOSIS — E669 Obesity, unspecified: Secondary | ICD-10-CM | POA: Diagnosis not present

## 2015-02-26 DIAGNOSIS — F411 Generalized anxiety disorder: Secondary | ICD-10-CM | POA: Diagnosis not present

## 2015-02-26 DIAGNOSIS — R7309 Other abnormal glucose: Secondary | ICD-10-CM | POA: Diagnosis not present

## 2015-02-26 DIAGNOSIS — E041 Nontoxic single thyroid nodule: Secondary | ICD-10-CM | POA: Diagnosis not present

## 2015-02-26 DIAGNOSIS — I10 Essential (primary) hypertension: Secondary | ICD-10-CM | POA: Diagnosis not present

## 2015-02-26 DIAGNOSIS — E785 Hyperlipidemia, unspecified: Secondary | ICD-10-CM | POA: Diagnosis not present

## 2015-02-26 DIAGNOSIS — Z0000 Encounter for general adult medical examination without abnormal findings: Secondary | ICD-10-CM | POA: Diagnosis not present

## 2015-03-08 DIAGNOSIS — E2839 Other primary ovarian failure: Secondary | ICD-10-CM | POA: Diagnosis not present

## 2015-04-30 DIAGNOSIS — H524 Presbyopia: Secondary | ICD-10-CM | POA: Diagnosis not present

## 2015-04-30 DIAGNOSIS — H40013 Open angle with borderline findings, low risk, bilateral: Secondary | ICD-10-CM | POA: Diagnosis not present

## 2015-05-30 DIAGNOSIS — I10 Essential (primary) hypertension: Secondary | ICD-10-CM | POA: Diagnosis not present

## 2015-05-30 DIAGNOSIS — G4700 Insomnia, unspecified: Secondary | ICD-10-CM | POA: Diagnosis not present

## 2015-05-30 DIAGNOSIS — R7309 Other abnormal glucose: Secondary | ICD-10-CM | POA: Diagnosis not present

## 2015-05-30 DIAGNOSIS — F411 Generalized anxiety disorder: Secondary | ICD-10-CM | POA: Diagnosis not present

## 2015-05-30 DIAGNOSIS — E041 Nontoxic single thyroid nodule: Secondary | ICD-10-CM | POA: Diagnosis not present

## 2015-05-30 DIAGNOSIS — E785 Hyperlipidemia, unspecified: Secondary | ICD-10-CM | POA: Diagnosis not present

## 2015-05-30 DIAGNOSIS — E669 Obesity, unspecified: Secondary | ICD-10-CM | POA: Diagnosis not present

## 2015-05-30 DIAGNOSIS — Z0000 Encounter for general adult medical examination without abnormal findings: Secondary | ICD-10-CM | POA: Diagnosis not present

## 2015-06-14 DIAGNOSIS — D2262 Melanocytic nevi of left upper limb, including shoulder: Secondary | ICD-10-CM | POA: Diagnosis not present

## 2015-06-14 DIAGNOSIS — L570 Actinic keratosis: Secondary | ICD-10-CM | POA: Diagnosis not present

## 2015-06-14 DIAGNOSIS — L821 Other seborrheic keratosis: Secondary | ICD-10-CM | POA: Diagnosis not present

## 2015-06-14 DIAGNOSIS — D225 Melanocytic nevi of trunk: Secondary | ICD-10-CM | POA: Diagnosis not present

## 2015-06-14 DIAGNOSIS — L237 Allergic contact dermatitis due to plants, except food: Secondary | ICD-10-CM | POA: Diagnosis not present

## 2015-08-27 DIAGNOSIS — I10 Essential (primary) hypertension: Secondary | ICD-10-CM | POA: Diagnosis not present

## 2015-08-27 DIAGNOSIS — M1990 Unspecified osteoarthritis, unspecified site: Secondary | ICD-10-CM | POA: Diagnosis not present

## 2015-08-27 DIAGNOSIS — R7309 Other abnormal glucose: Secondary | ICD-10-CM | POA: Diagnosis not present

## 2015-08-27 DIAGNOSIS — G4700 Insomnia, unspecified: Secondary | ICD-10-CM | POA: Diagnosis not present

## 2015-08-27 DIAGNOSIS — E041 Nontoxic single thyroid nodule: Secondary | ICD-10-CM | POA: Diagnosis not present

## 2015-08-27 DIAGNOSIS — E785 Hyperlipidemia, unspecified: Secondary | ICD-10-CM | POA: Diagnosis not present

## 2015-08-27 DIAGNOSIS — E669 Obesity, unspecified: Secondary | ICD-10-CM | POA: Diagnosis not present

## 2015-08-27 DIAGNOSIS — F411 Generalized anxiety disorder: Secondary | ICD-10-CM | POA: Diagnosis not present

## 2015-11-08 ENCOUNTER — Other Ambulatory Visit: Payer: Self-pay

## 2015-11-08 DIAGNOSIS — Z1231 Encounter for screening mammogram for malignant neoplasm of breast: Secondary | ICD-10-CM

## 2015-11-09 ENCOUNTER — Ambulatory Visit
Admission: RE | Admit: 2015-11-09 | Discharge: 2015-11-09 | Disposition: A | Discharge status: Home / Self Care | Payer: Medicare Other | Source: Ambulatory Visit

## 2015-11-09 DIAGNOSIS — Z1231 Encounter for screening mammogram for malignant neoplasm of breast: Secondary | ICD-10-CM | POA: Diagnosis not present

## 2015-11-15 DIAGNOSIS — J329 Chronic sinusitis, unspecified: Secondary | ICD-10-CM | POA: Diagnosis not present

## 2015-12-28 DIAGNOSIS — D120 Benign neoplasm of cecum: Secondary | ICD-10-CM | POA: Diagnosis not present

## 2015-12-28 DIAGNOSIS — Z1211 Encounter for screening for malignant neoplasm of colon: Secondary | ICD-10-CM | POA: Diagnosis not present

## 2015-12-28 DIAGNOSIS — K5730 Diverticulosis of large intestine without perforation or abscess without bleeding: Secondary | ICD-10-CM | POA: Diagnosis not present

## 2015-12-28 DIAGNOSIS — E079 Disorder of thyroid, unspecified: Secondary | ICD-10-CM | POA: Diagnosis not present

## 2015-12-28 DIAGNOSIS — Z885 Allergy status to narcotic agent status: Secondary | ICD-10-CM | POA: Diagnosis not present

## 2015-12-28 DIAGNOSIS — K644 Residual hemorrhoidal skin tags: Secondary | ICD-10-CM | POA: Diagnosis not present

## 2015-12-28 DIAGNOSIS — Z79899 Other long term (current) drug therapy: Secondary | ICD-10-CM | POA: Diagnosis not present

## 2015-12-28 DIAGNOSIS — Z86010 Personal history of colonic polyps: Secondary | ICD-10-CM | POA: Diagnosis not present

## 2015-12-28 DIAGNOSIS — K635 Polyp of colon: Secondary | ICD-10-CM | POA: Diagnosis not present

## 2015-12-28 DIAGNOSIS — Z882 Allergy status to sulfonamides status: Secondary | ICD-10-CM | POA: Diagnosis not present

## 2015-12-28 DIAGNOSIS — Z90710 Acquired absence of both cervix and uterus: Secondary | ICD-10-CM | POA: Diagnosis not present

## 2015-12-28 DIAGNOSIS — K219 Gastro-esophageal reflux disease without esophagitis: Secondary | ICD-10-CM | POA: Diagnosis not present

## 2015-12-28 DIAGNOSIS — I10 Essential (primary) hypertension: Secondary | ICD-10-CM | POA: Diagnosis not present

## 2016-02-26 DIAGNOSIS — R0981 Nasal congestion: Secondary | ICD-10-CM | POA: Diagnosis not present

## 2016-02-28 DIAGNOSIS — J040 Acute laryngitis: Secondary | ICD-10-CM | POA: Diagnosis not present

## 2016-02-28 DIAGNOSIS — J029 Acute pharyngitis, unspecified: Secondary | ICD-10-CM | POA: Diagnosis not present

## 2016-02-28 DIAGNOSIS — J209 Acute bronchitis, unspecified: Secondary | ICD-10-CM | POA: Diagnosis not present

## 2016-03-03 DIAGNOSIS — R05 Cough: Secondary | ICD-10-CM | POA: Diagnosis not present

## 2016-05-13 DIAGNOSIS — H353131 Nonexudative age-related macular degeneration, bilateral, early dry stage: Secondary | ICD-10-CM | POA: Diagnosis not present

## 2016-05-29 DIAGNOSIS — R7309 Other abnormal glucose: Secondary | ICD-10-CM | POA: Diagnosis not present

## 2016-05-29 DIAGNOSIS — Z6831 Body mass index (BMI) 31.0-31.9, adult: Secondary | ICD-10-CM | POA: Diagnosis not present

## 2016-05-29 DIAGNOSIS — R7303 Prediabetes: Secondary | ICD-10-CM | POA: Diagnosis not present

## 2016-05-29 DIAGNOSIS — Z0000 Encounter for general adult medical examination without abnormal findings: Secondary | ICD-10-CM | POA: Diagnosis not present

## 2016-05-29 DIAGNOSIS — E041 Nontoxic single thyroid nodule: Secondary | ICD-10-CM | POA: Diagnosis not present

## 2016-05-29 DIAGNOSIS — E785 Hyperlipidemia, unspecified: Secondary | ICD-10-CM | POA: Diagnosis not present

## 2016-05-29 DIAGNOSIS — I10 Essential (primary) hypertension: Secondary | ICD-10-CM | POA: Diagnosis not present

## 2016-05-29 DIAGNOSIS — E669 Obesity, unspecified: Secondary | ICD-10-CM | POA: Diagnosis not present

## 2016-05-29 DIAGNOSIS — G4700 Insomnia, unspecified: Secondary | ICD-10-CM | POA: Diagnosis not present

## 2016-05-29 DIAGNOSIS — F411 Generalized anxiety disorder: Secondary | ICD-10-CM | POA: Diagnosis not present

## 2016-06-17 DIAGNOSIS — L821 Other seborrheic keratosis: Secondary | ICD-10-CM | POA: Diagnosis not present

## 2016-06-17 DIAGNOSIS — L814 Other melanin hyperpigmentation: Secondary | ICD-10-CM | POA: Diagnosis not present

## 2016-06-17 DIAGNOSIS — D2272 Melanocytic nevi of left lower limb, including hip: Secondary | ICD-10-CM | POA: Diagnosis not present

## 2016-06-17 DIAGNOSIS — D1801 Hemangioma of skin and subcutaneous tissue: Secondary | ICD-10-CM | POA: Diagnosis not present

## 2016-06-17 DIAGNOSIS — D2239 Melanocytic nevi of other parts of face: Secondary | ICD-10-CM | POA: Diagnosis not present

## 2016-06-17 DIAGNOSIS — D220 Melanocytic nevi of lip: Secondary | ICD-10-CM | POA: Diagnosis not present

## 2016-06-17 DIAGNOSIS — D2261 Melanocytic nevi of right upper limb, including shoulder: Secondary | ICD-10-CM | POA: Diagnosis not present

## 2016-06-17 DIAGNOSIS — D225 Melanocytic nevi of trunk: Secondary | ICD-10-CM | POA: Diagnosis not present

## 2016-12-30 ENCOUNTER — Other Ambulatory Visit: Payer: Self-pay | Admitting: Family Medicine

## 2016-12-30 DIAGNOSIS — Z1231 Encounter for screening mammogram for malignant neoplasm of breast: Secondary | ICD-10-CM

## 2017-01-01 DIAGNOSIS — Z6832 Body mass index (BMI) 32.0-32.9, adult: Secondary | ICD-10-CM | POA: Diagnosis not present

## 2017-01-01 DIAGNOSIS — F411 Generalized anxiety disorder: Secondary | ICD-10-CM | POA: Diagnosis not present

## 2017-01-01 DIAGNOSIS — E785 Hyperlipidemia, unspecified: Secondary | ICD-10-CM | POA: Diagnosis not present

## 2017-01-01 DIAGNOSIS — E669 Obesity, unspecified: Secondary | ICD-10-CM | POA: Diagnosis not present

## 2017-01-01 DIAGNOSIS — G4700 Insomnia, unspecified: Secondary | ICD-10-CM | POA: Diagnosis not present

## 2017-01-01 DIAGNOSIS — I10 Essential (primary) hypertension: Secondary | ICD-10-CM | POA: Diagnosis not present

## 2017-01-01 DIAGNOSIS — E041 Nontoxic single thyroid nodule: Secondary | ICD-10-CM | POA: Diagnosis not present

## 2017-01-01 DIAGNOSIS — R7303 Prediabetes: Secondary | ICD-10-CM | POA: Diagnosis not present

## 2017-01-09 ENCOUNTER — Ambulatory Visit
Admission: RE | Admit: 2017-01-09 | Discharge: 2017-01-09 | Disposition: A | Discharge status: Home / Self Care | Payer: Medicare Other | Source: Ambulatory Visit | Attending: Family Medicine | Admitting: Family Medicine

## 2017-01-09 DIAGNOSIS — Z1231 Encounter for screening mammogram for malignant neoplasm of breast: Secondary | ICD-10-CM

## 2017-01-12 ENCOUNTER — Other Ambulatory Visit: Payer: Self-pay | Admitting: Family Medicine

## 2017-01-12 DIAGNOSIS — R928 Other abnormal and inconclusive findings on diagnostic imaging of breast: Secondary | ICD-10-CM

## 2017-01-13 ENCOUNTER — Other Ambulatory Visit: Payer: Self-pay | Admitting: Family Medicine

## 2017-01-13 ENCOUNTER — Ambulatory Visit
Admission: RE | Admit: 2017-01-13 | Discharge: 2017-01-13 | Disposition: A | Discharge status: Home / Self Care | Payer: Medicare Other | Source: Ambulatory Visit | Attending: Family Medicine | Admitting: Family Medicine

## 2017-01-13 DIAGNOSIS — R928 Other abnormal and inconclusive findings on diagnostic imaging of breast: Secondary | ICD-10-CM

## 2017-01-13 DIAGNOSIS — R921 Mammographic calcification found on diagnostic imaging of breast: Secondary | ICD-10-CM | POA: Diagnosis not present

## 2017-01-19 ENCOUNTER — Ambulatory Visit
Admission: RE | Admit: 2017-01-19 | Discharge: 2017-01-19 | Disposition: A | Discharge status: Home / Self Care | Payer: Medicare Other | Source: Ambulatory Visit | Attending: Family Medicine | Admitting: Family Medicine

## 2017-01-19 ENCOUNTER — Other Ambulatory Visit: Payer: Self-pay | Admitting: Family Medicine

## 2017-01-19 DIAGNOSIS — R928 Other abnormal and inconclusive findings on diagnostic imaging of breast: Secondary | ICD-10-CM

## 2017-01-19 DIAGNOSIS — N642 Atrophy of breast: Secondary | ICD-10-CM | POA: Diagnosis not present

## 2017-01-19 DIAGNOSIS — R921 Mammographic calcification found on diagnostic imaging of breast: Secondary | ICD-10-CM | POA: Diagnosis not present

## 2017-06-04 DIAGNOSIS — L820 Inflamed seborrheic keratosis: Secondary | ICD-10-CM | POA: Diagnosis not present

## 2017-06-04 DIAGNOSIS — D1801 Hemangioma of skin and subcutaneous tissue: Secondary | ICD-10-CM | POA: Diagnosis not present

## 2017-06-04 DIAGNOSIS — L821 Other seborrheic keratosis: Secondary | ICD-10-CM | POA: Diagnosis not present

## 2017-06-04 DIAGNOSIS — D2272 Melanocytic nevi of left lower limb, including hip: Secondary | ICD-10-CM | POA: Diagnosis not present

## 2017-06-04 DIAGNOSIS — L814 Other melanin hyperpigmentation: Secondary | ICD-10-CM | POA: Diagnosis not present

## 2017-06-04 DIAGNOSIS — D220 Melanocytic nevi of lip: Secondary | ICD-10-CM | POA: Diagnosis not present

## 2017-07-09 DIAGNOSIS — Z6832 Body mass index (BMI) 32.0-32.9, adult: Secondary | ICD-10-CM | POA: Diagnosis not present

## 2017-07-09 DIAGNOSIS — E041 Nontoxic single thyroid nodule: Secondary | ICD-10-CM | POA: Diagnosis not present

## 2017-07-09 DIAGNOSIS — Z0000 Encounter for general adult medical examination without abnormal findings: Secondary | ICD-10-CM | POA: Diagnosis not present

## 2017-07-09 DIAGNOSIS — Z1159 Encounter for screening for other viral diseases: Secondary | ICD-10-CM | POA: Diagnosis not present

## 2017-07-09 DIAGNOSIS — I10 Essential (primary) hypertension: Secondary | ICD-10-CM | POA: Diagnosis not present

## 2017-07-09 DIAGNOSIS — F411 Generalized anxiety disorder: Secondary | ICD-10-CM | POA: Diagnosis not present

## 2017-07-09 DIAGNOSIS — E669 Obesity, unspecified: Secondary | ICD-10-CM | POA: Diagnosis not present

## 2017-07-09 DIAGNOSIS — R7303 Prediabetes: Secondary | ICD-10-CM | POA: Diagnosis not present

## 2017-07-09 DIAGNOSIS — E785 Hyperlipidemia, unspecified: Secondary | ICD-10-CM | POA: Diagnosis not present

## 2017-07-09 DIAGNOSIS — G4700 Insomnia, unspecified: Secondary | ICD-10-CM | POA: Diagnosis not present

## 2017-07-10 DIAGNOSIS — Z1159 Encounter for screening for other viral diseases: Secondary | ICD-10-CM | POA: Diagnosis not present

## 2017-07-10 DIAGNOSIS — G4700 Insomnia, unspecified: Secondary | ICD-10-CM | POA: Diagnosis not present

## 2017-07-10 DIAGNOSIS — Z0000 Encounter for general adult medical examination without abnormal findings: Secondary | ICD-10-CM | POA: Diagnosis not present

## 2017-07-10 DIAGNOSIS — R7303 Prediabetes: Secondary | ICD-10-CM | POA: Diagnosis not present

## 2017-07-10 DIAGNOSIS — I10 Essential (primary) hypertension: Secondary | ICD-10-CM | POA: Diagnosis not present

## 2017-07-10 DIAGNOSIS — E785 Hyperlipidemia, unspecified: Secondary | ICD-10-CM | POA: Diagnosis not present

## 2017-07-10 DIAGNOSIS — F411 Generalized anxiety disorder: Secondary | ICD-10-CM | POA: Diagnosis not present

## 2017-07-10 DIAGNOSIS — E041 Nontoxic single thyroid nodule: Secondary | ICD-10-CM | POA: Diagnosis not present

## 2017-07-10 DIAGNOSIS — E669 Obesity, unspecified: Secondary | ICD-10-CM | POA: Diagnosis not present

## 2017-08-18 ENCOUNTER — Encounter: Payer: Self-pay | Admitting: Dietician

## 2017-08-18 ENCOUNTER — Encounter: Payer: Medicare Other | Attending: Family Medicine | Admitting: Dietician

## 2017-08-18 DIAGNOSIS — E119 Type 2 diabetes mellitus without complications: Secondary | ICD-10-CM | POA: Diagnosis not present

## 2017-08-18 DIAGNOSIS — I10 Essential (primary) hypertension: Secondary | ICD-10-CM | POA: Diagnosis not present

## 2017-08-18 DIAGNOSIS — Z713 Dietary counseling and surveillance: Secondary | ICD-10-CM | POA: Insufficient documentation

## 2017-08-18 DIAGNOSIS — E669 Obesity, unspecified: Secondary | ICD-10-CM | POA: Insufficient documentation

## 2017-08-18 NOTE — Progress Notes (Signed)
Patient was seen on 08/18/17 for the first of a series of three diabetes self-management courses at the [facility].  Patient Education Plan per assessed needs and concerns is to attend four course education program for Diabetes Self Management Education.  The following learning objectives were met by the patient during this class:  Describe diabetes  State some common risk factors for diabetes  Defines the role of glucose and insulin  Identifies type of diabetes and pathophysiology  Describe the relationship between diabetes and cardiovascular risk  State the members of the Healthcare Team  States the rationale for glucose monitoring  State when to test glucose  State their individual Target Range  State the importance of logging glucose readings  Describe how to interpret glucose readings  Identifies A1C target  Explain the correlation between A1c and eAG values  State symptoms and treatment of high blood glucose  State symptoms and treatment of low blood glucose  Explain proper technique for glucose testing  Identifies proper sharps disposal  Handouts given during class include:  Living Well with Diabetes book  Carb Counting and Meal Planning book  Meal Plan Card  Carbohydrate guide  Meal planning worksheet  Low Sodium Flavoring Tips  The diabetes portion plate  E5U to eAG Conversion Chart  Diabetes Medications  Diabetes Recommended Care Schedule  Support Group  Diabetes Success Plan  Core Class Satisfaction Survey   . The patient's Newest Vital Sign Health Literacy Assessment score was 5 out of 6. . The patient scored 72% on the Diabetes Knowledge pre-test. . Educational strategies utilized during class included repetition, teach-back, eliminating medical jargon, and being open to questions.   Follow-Up Plan:  Attend core 2

## 2017-08-25 ENCOUNTER — Encounter: Payer: Medicare Other | Admitting: Dietician

## 2017-08-25 DIAGNOSIS — E669 Obesity, unspecified: Secondary | ICD-10-CM | POA: Diagnosis not present

## 2017-08-25 DIAGNOSIS — E119 Type 2 diabetes mellitus without complications: Secondary | ICD-10-CM | POA: Diagnosis not present

## 2017-08-25 DIAGNOSIS — I10 Essential (primary) hypertension: Secondary | ICD-10-CM | POA: Diagnosis not present

## 2017-08-25 DIAGNOSIS — Z713 Dietary counseling and surveillance: Secondary | ICD-10-CM | POA: Diagnosis not present

## 2017-08-25 NOTE — Progress Notes (Signed)
Patient was seen on 08/25/17 for the second of a series of three diabetes self-management courses at the [facility]. The following learning objectives were met by the patient during this class:   Describe the role of different macronutrients on glucose  Explain how carbohydrates affect blood glucose  State what foods contain the most carbohydrates  Demonstrate carbohydrate counting  Demonstrate how to read Nutrition Facts food label  Describe effects of various fats on heart health  Describe the importance of good nutrition for health and healthy eating strategies  Describe techniques for managing your shopping, cooking and meal planning  List strategies to follow meal plan when dining out  Describe the effects of alcohol on glucose and how to use it safely  Goals:  Follow Diabetes Meal Plan as instructed  Aim to spread carbs evenly throughout the day  Aim for 3 meals per day and snacks as needed Include lean protein foods to meals/snacks  Monitor glucose levels as instructed by your doctor   Follow-Up Plan:  Attend Core 3  Work towards following your personal food plan.   

## 2017-09-01 ENCOUNTER — Encounter: Payer: Medicare Other | Attending: Family Medicine | Admitting: Dietician

## 2017-09-01 DIAGNOSIS — E119 Type 2 diabetes mellitus without complications: Secondary | ICD-10-CM | POA: Diagnosis not present

## 2017-09-01 DIAGNOSIS — I10 Essential (primary) hypertension: Secondary | ICD-10-CM | POA: Diagnosis not present

## 2017-09-01 DIAGNOSIS — Z713 Dietary counseling and surveillance: Secondary | ICD-10-CM | POA: Diagnosis not present

## 2017-09-01 DIAGNOSIS — E669 Obesity, unspecified: Secondary | ICD-10-CM | POA: Insufficient documentation

## 2017-09-01 NOTE — Progress Notes (Signed)
Patient was seen on 09/01/17 for the third of a series of three diabetes self-management courses at the [facility].   Sheena Trevino the amount of activity recommended for healthy living . Describe activities suitable for individual needs . Identify ways to regularly incorporate activity into daily life . Identify barriers to activity and ways to over come these barriers  Identify diabetes medications being personally used and their primary action for lowering glucose and possible side effects . Describe role of stress on blood glucose and develop strategies to address psychosocial issues . Identify diabetes complications and ways to prevent them  Explain how to manage diabetes during illness . Evaluate success in meeting personal goal . Establish 2-3 goals that they will plan to diligently work on until they return for the  55-month follow-up visit  Goals:   I will count my carb choices at most meals and snacks  I will be active.  I will take my diabetes medications as scheduled  I will eat less unhealthy fats.  Your patient has identified these potential barriers to change:  none  Your patient has identified their diabetes self-care support plan as  Family Support  . The patient scored 66% on the Diabetes Knowledge post-test.   Plan:  Attend Support Group as desired

## 2017-09-28 DIAGNOSIS — L2489 Irritant contact dermatitis due to other agents: Secondary | ICD-10-CM | POA: Diagnosis not present

## 2017-10-12 DIAGNOSIS — Z7984 Long term (current) use of oral hypoglycemic drugs: Secondary | ICD-10-CM | POA: Diagnosis not present

## 2017-10-12 DIAGNOSIS — E119 Type 2 diabetes mellitus without complications: Secondary | ICD-10-CM | POA: Diagnosis not present

## 2017-10-12 DIAGNOSIS — F411 Generalized anxiety disorder: Secondary | ICD-10-CM | POA: Diagnosis not present

## 2017-10-12 DIAGNOSIS — E663 Overweight: Secondary | ICD-10-CM | POA: Diagnosis not present

## 2017-10-12 DIAGNOSIS — Z6829 Body mass index (BMI) 29.0-29.9, adult: Secondary | ICD-10-CM | POA: Diagnosis not present

## 2017-10-12 DIAGNOSIS — H353133 Nonexudative age-related macular degeneration, bilateral, advanced atrophic without subfoveal involvement: Secondary | ICD-10-CM | POA: Diagnosis not present

## 2017-10-12 DIAGNOSIS — H2513 Age-related nuclear cataract, bilateral: Secondary | ICD-10-CM | POA: Diagnosis not present

## 2018-01-04 DIAGNOSIS — J329 Chronic sinusitis, unspecified: Secondary | ICD-10-CM | POA: Diagnosis not present

## 2018-01-11 DIAGNOSIS — Z7984 Long term (current) use of oral hypoglycemic drugs: Secondary | ICD-10-CM | POA: Diagnosis not present

## 2018-01-11 DIAGNOSIS — E785 Hyperlipidemia, unspecified: Secondary | ICD-10-CM | POA: Diagnosis not present

## 2018-01-11 DIAGNOSIS — M25512 Pain in left shoulder: Secondary | ICD-10-CM | POA: Diagnosis not present

## 2018-01-11 DIAGNOSIS — E041 Nontoxic single thyroid nodule: Secondary | ICD-10-CM | POA: Diagnosis not present

## 2018-01-11 DIAGNOSIS — I10 Essential (primary) hypertension: Secondary | ICD-10-CM | POA: Diagnosis not present

## 2018-01-11 DIAGNOSIS — E119 Type 2 diabetes mellitus without complications: Secondary | ICD-10-CM | POA: Diagnosis not present

## 2018-01-20 ENCOUNTER — Ambulatory Visit (INDEPENDENT_AMBULATORY_CARE_PROVIDER_SITE_OTHER): Payer: Medicare Other | Admitting: Orthopedic Surgery

## 2018-01-20 ENCOUNTER — Encounter (INDEPENDENT_AMBULATORY_CARE_PROVIDER_SITE_OTHER): Payer: Self-pay | Admitting: Orthopedic Surgery

## 2018-01-20 ENCOUNTER — Ambulatory Visit (INDEPENDENT_AMBULATORY_CARE_PROVIDER_SITE_OTHER): Payer: Medicare Other

## 2018-01-20 DIAGNOSIS — G8929 Other chronic pain: Secondary | ICD-10-CM | POA: Diagnosis not present

## 2018-01-20 DIAGNOSIS — M542 Cervicalgia: Secondary | ICD-10-CM

## 2018-01-20 DIAGNOSIS — M25512 Pain in left shoulder: Secondary | ICD-10-CM

## 2018-01-23 ENCOUNTER — Encounter (INDEPENDENT_AMBULATORY_CARE_PROVIDER_SITE_OTHER): Payer: Self-pay | Admitting: Orthopedic Surgery

## 2018-01-23 DIAGNOSIS — M25512 Pain in left shoulder: Secondary | ICD-10-CM | POA: Diagnosis not present

## 2018-01-23 DIAGNOSIS — G8929 Other chronic pain: Secondary | ICD-10-CM | POA: Diagnosis not present

## 2018-01-23 DIAGNOSIS — M542 Cervicalgia: Secondary | ICD-10-CM | POA: Diagnosis not present

## 2018-01-23 MED ORDER — LIDOCAINE HCL 1 % IJ SOLN
5.0000 mL | INTRAMUSCULAR | Status: AC | PRN
  Administered 2018-01-23: 5 mL

## 2018-01-23 MED ORDER — BUPIVACAINE HCL 0.5 % IJ SOLN
9.0000 mL | INTRAMUSCULAR | Status: AC | PRN
  Administered 2018-01-23: 9 mL via INTRA_ARTICULAR

## 2018-01-23 MED ORDER — METHYLPREDNISOLONE ACETATE 40 MG/ML IJ SUSP
40.0000 mg | INTRAMUSCULAR | Status: AC | PRN
  Administered 2018-01-23: 40 mg via INTRA_ARTICULAR

## 2018-01-23 NOTE — Progress Notes (Signed)
Office Visit Note   Patient: Sheena Trevino           Date of Birth: April 19, 1946           MRN: [id number] Visit Date: 01/20/2018 Requested by: Sheena Trevino, [place], [place] [address] PCP: Sheena Lass, MD  Subjective: Chief Complaint  Patient presents with  . Left Shoulder - Pain    HPI: Sheena Trevino is a patient with left shoulder pain.  Denies any history of injury.  Part-time.  She has a history of right shoulder surgery.  The pain is been going on for 3 months and it is getting worse.  Patient states that the pain will wake her from sleep at night.  She takes Advil daily.  Reports left shoulder pain anteriorly and posteriorly.  Denies any numbness and tingling.  He states that her arm and shoulder hurt with movement.              ROS: All systems reviewed are negative as they relate to the chief complaint within the history of present illness.  Patient denies  fevers or chills.   Assessment & Plan: Visit Diagnoses:  1. Neck pain   2. Left shoulder pain, unspecified chronicity   3. Chronic left shoulder pain     Plan: Impression is left shoulder bursitis possible rotator cuff pathology.  Plan is subacromial injection today for symptom relief and MRI arthrogram to evaluate for possible rotator cuff tear..  I do not think this is radicular pain coming from her neck.  I will see her back after her study.  Follow-Up Instructions: Return in about 6 weeks (around 03/03/2018).   Orders:  Orders Placed This Encounter  Procedures  . XR Shoulder Left  . XR Cervical Spine 2 or 3 views  . Arthrogram  . MR Shoulder Left w/ contrast   No orders of the defined types were placed in this encounter.     Procedures: Large Joint Inj: L subacromial bursa on 01/23/2018 11:25 AM Indications: diagnostic evaluation and pain Details: 18 G 1.5 in needle, posterior approach  Arthrogram: No  Medications: 9 mL bupivacaine 0.5 %; 40 mg methylPREDNISolone acetate 40 MG/ML; 5 mL  lidocaine 1 % Outcome: tolerated well, no immediate complications Procedure, treatment alternatives, risks and benefits explained, specific risks discussed. Consent was given by the patient. Immediately prior to procedure a time out was called to verify the correct patient, procedure, equipment, support staff and site/side marked as required. Patient was prepped and draped in the usual sterile fashion.       Clinical Data: No additional findings.  Objective: Vital Signs: There were no vitals taken for this visit.  Physical Exam:   Constitutional: Patient appears well-developed HEENT:  Head: Normocephalic Eyes:EOM are normal Neck: Normal range of motion Cardiovascular: Normal rate Pulmonary/chest: Effort normal Neurologic: Patient is alert Skin: Skin is warm Psychiatric: Patient has normal mood and affect    Ortho Exam: Orthopedic exam demonstrates good cervical spine range of motion.  Patient has 5 out of 5 grip EPL FPL interosseous wrist flexion wrist extension biceps triceps and deltoid strength.  No paresthesias left versus right C5-T1.  Reflexes symmetric.  Radial pulse intact bilaterally.  Left shoulder is examined.  Slightly more coarse grinding with passive range of motion on the left compared to the right.  A little bit of weakness to supraspinatus testing on the left compared to right.  Negative apprehension relocation testing on the left.  No AC  joint tenderness to direct palpation.  O'Brien's testing equivocal on the left negative on the right.  No other masses lymphadenopathy or skin changes noted in the shoulder girdle region.  Specialty Comments:  No specialty comments available.  Imaging: No results found.   PMFS History: Patient Active Problem List   Diagnosis Date Noted  . Right thyroid nodule 09/05/2014  . 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 Anemia    in late twenties  . Anxiety    . Arthritis    "knees, fingers" (09/05/2014)  . Diabetes mellitus without complication ([facility])   . GERD (gastroesophageal reflux disease)   . H/O hiatal hernia   . Hyperlipidemia   . Hypertension   . Insomnia   . Irritable bowel    with stress  . Obesity   . PONV (postoperative nausea and vomiting)     Family History  Problem Relation Age of Onset  . Cancer Mother        lung  . Heart disease Father     Past Surgical History:  Procedure Laterality Date  . ABDOMINAL HYSTERECTOMY  1982   partial  . COLONOSCOPY    . FOOT SURGERY Right ~ 2004   "top of my foot"  . SHOULDER ARTHROSCOPY WITH OPEN ROTATOR CUFF REPAIR Right ~ 2000  . THYROID LOBECTOMY Right 09/05/2014  . THYROID LOBECTOMY Right 09/05/2014   Procedure: RIGHT THYROID LOBECTOMY;  Surgeon: Sheena Gemma, MD;  Location: [facility];  Service: General;  Laterality: Right;  . TUBAL LIGATION  1981   Social History   Occupational History  . Not on file  Tobacco Use  . Smoking status: Never Smoker  . Smokeless tobacco: Never Used  Substance and Sexual Activity  . Alcohol use: No  . Drug use: No  . Sexual activity: Not Currently

## 2018-01-27 ENCOUNTER — Other Ambulatory Visit: Payer: Self-pay | Admitting: Family Medicine

## 2018-01-27 DIAGNOSIS — Z1231 Encounter for screening mammogram for malignant neoplasm of breast: Secondary | ICD-10-CM

## 2018-02-01 ENCOUNTER — Telehealth (INDEPENDENT_AMBULATORY_CARE_PROVIDER_SITE_OTHER): Payer: Self-pay | Admitting: Orthopedic Surgery

## 2018-02-01 MED ORDER — DIAZEPAM 5 MG PO TABS: ORAL_TABLET | ORAL | 0 refills | Status: DC

## 2018-02-01 NOTE — Telephone Encounter (Signed)
[place] for pre-med prior to scan?

## 2018-02-01 NOTE — Telephone Encounter (Signed)
y

## 2018-02-01 NOTE — Telephone Encounter (Signed)
Patient has MRI scheduled for 02/15. She said last time Dr. Marlou Sa had prescribed her something before her appt due to her claustrophobia. Please advise # [phone]

## 2018-02-01 NOTE — Telephone Encounter (Signed)
rx called to pharmacy. Patient advised done.

## 2018-02-12 ENCOUNTER — Other Ambulatory Visit: Payer: Medicare Other

## 2018-02-16 ENCOUNTER — Ambulatory Visit
Admission: RE | Admit: 2018-02-16 | Discharge: 2018-02-16 | Disposition: A | Discharge status: Home / Self Care | Payer: Medicare Other | Source: Ambulatory Visit | Attending: Family Medicine | Admitting: Family Medicine

## 2018-02-16 DIAGNOSIS — Z1231 Encounter for screening mammogram for malignant neoplasm of breast: Secondary | ICD-10-CM

## 2018-02-17 ENCOUNTER — Ambulatory Visit (INDEPENDENT_AMBULATORY_CARE_PROVIDER_SITE_OTHER): Payer: Medicare Other | Admitting: Orthopedic Surgery

## 2018-03-08 ENCOUNTER — Ambulatory Visit
Admission: RE | Admit: 2018-03-08 | Discharge: 2018-03-08 | Disposition: A | Discharge status: Home / Self Care | Payer: Medicare Other | Source: Ambulatory Visit | Attending: Orthopedic Surgery | Admitting: Orthopedic Surgery

## 2018-03-08 DIAGNOSIS — S46012A Strain of muscle(s) and tendon(s) of the rotator cuff of left shoulder, initial encounter: Secondary | ICD-10-CM | POA: Diagnosis not present

## 2018-03-08 DIAGNOSIS — G8929 Other chronic pain: Secondary | ICD-10-CM

## 2018-03-08 DIAGNOSIS — M25512 Pain in left shoulder: Secondary | ICD-10-CM | POA: Diagnosis not present

## 2018-03-08 MED ORDER — IOPAMIDOL (ISOVUE-M 200) INJECTION 41%
12.0000 mL | Freq: Once | INTRAMUSCULAR | Status: AC
  Administered 2018-03-08: 12 mL via INTRA_ARTICULAR

## 2018-03-11 ENCOUNTER — Encounter (INDEPENDENT_AMBULATORY_CARE_PROVIDER_SITE_OTHER): Payer: Self-pay | Admitting: Orthopedic Surgery

## 2018-03-11 ENCOUNTER — Ambulatory Visit (INDEPENDENT_AMBULATORY_CARE_PROVIDER_SITE_OTHER): Payer: Medicare Other | Admitting: Orthopedic Surgery

## 2018-03-11 DIAGNOSIS — M75122 Complete rotator cuff tear or rupture of left shoulder, not specified as traumatic: Secondary | ICD-10-CM

## 2018-03-11 NOTE — Progress Notes (Signed)
Office Visit Note   Patient: Sheena Trevino           Date of Birth: [birth year]           MRN: [id number] Visit Date: 03/11/2018 Requested by: Kathyrn Lass, [place], [place] [address] PCP: Kathyrn Lass, MD  Subjective: Chief Complaint  Patient presents with  . Left Shoulder - Follow-up    HPI: Patient presents for evaluation of left shoulder.  Since I have seen her she has had an MRI scan which is reviewed.  She has a very large and retracted tear of the supraspinatus.  No atrophy of the muscles which is surprising.  Currently she rates her symptoms as tolerable.  She states that when she lifts and does activities it is painful.  She had right shoulder rotator cuff surgery 15 years ago and did well with that.  She has had an injection in the left shoulder which helped her for a few weeks but the pain has now recurred.              ROS: All systems reviewed are negative as they relate to the chief complaint within the history of present illness.  Patient denies  fevers or chills.   Assessment & Plan: Visit Diagnoses:  1. Complete tear of left rotator cuff     Plan: Impression is rotator cuff tear retracted in a 72 year old patient.  I think this tear may be partially repairable but I do not see mobilization enough to get this all the way back to a watertight repair.  She is having possible biceps related symptoms.  This could be addressed arthroscopically.  She may be heading for reverse shoulder replacement within 10 years based on natural history of this particular problem.  For now she has functional range of motion and some pain.  She is going to live with what she has for now and if she wants to try a partial repair that can be done.  I do not think she is a great candidate for superior capsular reconstruction.  I will see her back as needed  Follow-Up Instructions: Return if symptoms worsen or fail to improve.   Orders:  No orders of the defined types were  placed in this encounter.  No orders of the defined types were placed in this encounter.     Procedures: No procedures performed   Clinical Data: No additional findings.  Objective: Vital Signs: There were no vitals taken for this visit.  Physical Exam:   Constitutional: Patient appears well-developed HEENT:  Head: Normocephalic Eyes:EOM are normal Neck: Normal range of motion Cardiovascular: Normal rate Pulmonary/chest: Effort normal Neurologic: Patient is alert Skin: Skin is warm Psychiatric: Patient has normal mood and affect    Ortho Exam: Orthopedic exam demonstrates good cervical spine range of motion.  She has a little weakness to infraspinatus and supraspinatus testing on the left compared to the right at 4+ out of 5 but in general she does have full abduction over 90 degrees and full forward flexion to about 170.  Not much in the way of coarse grinding or crepitus with active or passive range of motion of that left shoulder.  No other masses lymph adenopathy or skin changes noted in the left shoulder girdle region.  No AC joint tenderness to direct palpation.  Specialty Comments:  No specialty comments available.  Imaging: No results found.   PMFS History: Patient Active Problem List   Diagnosis Date Noted  .  Right thyroid nodule 09/05/2014  . 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 Anemia    in late twenties  . Anxiety   . Arthritis    "knees, fingers" (09/05/2014)  . Diabetes mellitus without complication ([facility])   . GERD (gastroesophageal reflux disease)   . H/O hiatal hernia   . Hyperlipidemia   . Hypertension   . Insomnia   . Irritable bowel    with stress  . Obesity   . PONV (postoperative nausea and vomiting)     Family History  Problem Relation Age of Onset  . Cancer Mother        lung  . Heart disease Father   . Breast cancer Neg Hx     Past Surgical History:    Procedure Laterality Date  . ABDOMINAL HYSTERECTOMY  1982   partial  . COLONOSCOPY    . FOOT SURGERY Right ~ 2004   "top of my foot"  . SHOULDER ARTHROSCOPY WITH OPEN ROTATOR CUFF REPAIR Right ~ 2000  . THYROID LOBECTOMY Right 09/05/2014  . THYROID LOBECTOMY Right 09/05/2014   Procedure: RIGHT THYROID LOBECTOMY;  Surgeon: Armandina Gemma, MD;  Location: [facility];  Service: General;  Laterality: Right;  . TUBAL LIGATION  1981   Social History   Occupational History  . Not on file  Tobacco Use  . Smoking status: Never Smoker  . Smokeless tobacco: Never Used  Substance and Sexual Activity  . Alcohol use: No  . Drug use: No  . Sexual activity: Not Currently

## 2018-06-04 DIAGNOSIS — D225 Melanocytic nevi of trunk: Secondary | ICD-10-CM | POA: Diagnosis not present

## 2018-06-04 DIAGNOSIS — L821 Other seborrheic keratosis: Secondary | ICD-10-CM | POA: Diagnosis not present

## 2018-06-04 DIAGNOSIS — D2272 Melanocytic nevi of left lower limb, including hip: Secondary | ICD-10-CM | POA: Diagnosis not present

## 2018-06-04 DIAGNOSIS — L814 Other melanin hyperpigmentation: Secondary | ICD-10-CM | POA: Diagnosis not present

## 2018-06-04 DIAGNOSIS — D1801 Hemangioma of skin and subcutaneous tissue: Secondary | ICD-10-CM | POA: Diagnosis not present

## 2018-06-04 DIAGNOSIS — D220 Melanocytic nevi of lip: Secondary | ICD-10-CM | POA: Diagnosis not present

## 2018-07-22 DIAGNOSIS — I10 Essential (primary) hypertension: Secondary | ICD-10-CM | POA: Diagnosis not present

## 2018-07-22 DIAGNOSIS — Z6828 Body mass index (BMI) 28.0-28.9, adult: Secondary | ICD-10-CM | POA: Diagnosis not present

## 2018-07-22 DIAGNOSIS — E663 Overweight: Secondary | ICD-10-CM | POA: Diagnosis not present

## 2018-07-22 DIAGNOSIS — G4700 Insomnia, unspecified: Secondary | ICD-10-CM | POA: Diagnosis not present

## 2018-07-22 DIAGNOSIS — E785 Hyperlipidemia, unspecified: Secondary | ICD-10-CM | POA: Diagnosis not present

## 2018-07-22 DIAGNOSIS — E041 Nontoxic single thyroid nodule: Secondary | ICD-10-CM | POA: Diagnosis not present

## 2018-07-22 DIAGNOSIS — F411 Generalized anxiety disorder: Secondary | ICD-10-CM | POA: Diagnosis not present

## 2018-07-22 DIAGNOSIS — Z0000 Encounter for general adult medical examination without abnormal findings: Secondary | ICD-10-CM | POA: Diagnosis not present

## 2018-07-22 DIAGNOSIS — E1169 Type 2 diabetes mellitus with other specified complication: Secondary | ICD-10-CM | POA: Diagnosis not present

## 2018-10-21 DIAGNOSIS — E041 Nontoxic single thyroid nodule: Secondary | ICD-10-CM | POA: Diagnosis not present

## 2018-10-21 DIAGNOSIS — I10 Essential (primary) hypertension: Secondary | ICD-10-CM | POA: Diagnosis not present

## 2019-01-25 DIAGNOSIS — I10 Essential (primary) hypertension: Secondary | ICD-10-CM | POA: Diagnosis not present

## 2019-01-25 DIAGNOSIS — F419 Anxiety disorder, unspecified: Secondary | ICD-10-CM | POA: Diagnosis not present

## 2019-01-25 DIAGNOSIS — E041 Nontoxic single thyroid nodule: Secondary | ICD-10-CM | POA: Diagnosis not present

## 2019-01-25 DIAGNOSIS — R450 Nervousness: Secondary | ICD-10-CM | POA: Diagnosis not present

## 2019-01-25 DIAGNOSIS — E1169 Type 2 diabetes mellitus with other specified complication: Secondary | ICD-10-CM | POA: Diagnosis not present

## 2019-01-25 DIAGNOSIS — R635 Abnormal weight gain: Secondary | ICD-10-CM | POA: Diagnosis not present

## 2019-01-25 DIAGNOSIS — E785 Hyperlipidemia, unspecified: Secondary | ICD-10-CM | POA: Diagnosis not present

## 2019-01-25 DIAGNOSIS — L659 Nonscarring hair loss, unspecified: Secondary | ICD-10-CM | POA: Diagnosis not present

## 2019-01-25 DIAGNOSIS — Z6830 Body mass index (BMI) 30.0-30.9, adult: Secondary | ICD-10-CM | POA: Diagnosis not present

## 2019-01-25 DIAGNOSIS — E669 Obesity, unspecified: Secondary | ICD-10-CM | POA: Diagnosis not present

## 2019-02-22 ENCOUNTER — Other Ambulatory Visit: Payer: Self-pay | Admitting: Family Medicine

## 2019-02-22 DIAGNOSIS — Z1231 Encounter for screening mammogram for malignant neoplasm of breast: Secondary | ICD-10-CM

## 2019-03-21 ENCOUNTER — Ambulatory Visit: Payer: Medicare Other

## 2019-04-18 ENCOUNTER — Ambulatory Visit: Payer: Medicare Other

## 2019-04-19 DIAGNOSIS — I10 Essential (primary) hypertension: Secondary | ICD-10-CM | POA: Diagnosis not present

## 2019-05-30 ENCOUNTER — Ambulatory Visit: Payer: Medicare Other

## 2019-06-18 ENCOUNTER — Ambulatory Visit
Admission: RE | Admit: 2019-06-18 | Discharge: 2019-06-18 | Disposition: A | Discharge status: Home / Self Care | Payer: Medicare Other | Source: Ambulatory Visit | Attending: Family Medicine | Admitting: Family Medicine

## 2019-06-18 ENCOUNTER — Other Ambulatory Visit: Payer: Self-pay

## 2019-06-18 DIAGNOSIS — Z1231 Encounter for screening mammogram for malignant neoplasm of breast: Secondary | ICD-10-CM | POA: Diagnosis not present

## 2019-07-06 ENCOUNTER — Ambulatory Visit (INDEPENDENT_AMBULATORY_CARE_PROVIDER_SITE_OTHER): Payer: Medicare Other

## 2019-07-06 ENCOUNTER — Other Ambulatory Visit: Payer: Self-pay

## 2019-07-06 ENCOUNTER — Ambulatory Visit (INDEPENDENT_AMBULATORY_CARE_PROVIDER_SITE_OTHER): Payer: Medicare Other | Admitting: Orthopedic Surgery

## 2019-07-06 DIAGNOSIS — D220 Melanocytic nevi of lip: Secondary | ICD-10-CM | POA: Diagnosis not present

## 2019-07-06 DIAGNOSIS — D225 Melanocytic nevi of trunk: Secondary | ICD-10-CM | POA: Diagnosis not present

## 2019-07-06 DIAGNOSIS — M25562 Pain in left knee: Secondary | ICD-10-CM

## 2019-07-06 DIAGNOSIS — L814 Other melanin hyperpigmentation: Secondary | ICD-10-CM | POA: Diagnosis not present

## 2019-07-06 DIAGNOSIS — L821 Other seborrheic keratosis: Secondary | ICD-10-CM | POA: Diagnosis not present

## 2019-07-06 DIAGNOSIS — D2272 Melanocytic nevi of left lower limb, including hip: Secondary | ICD-10-CM | POA: Diagnosis not present

## 2019-07-06 DIAGNOSIS — L308 Other specified dermatitis: Secondary | ICD-10-CM | POA: Diagnosis not present

## 2019-07-06 DIAGNOSIS — D1801 Hemangioma of skin and subcutaneous tissue: Secondary | ICD-10-CM | POA: Diagnosis not present

## 2019-07-07 ENCOUNTER — Encounter: Payer: Self-pay | Admitting: Orthopedic Surgery

## 2019-07-07 NOTE — Progress Notes (Signed)
Office Visit Note   Patient: Sheena Trevino           Date of Birth: [birth year]           MRN: [id number] Visit Date: 07/06/2019 Requested by: Kathyrn Lass, [place],  [place] [address] PCP: Kathyrn Lass, MD  Subjective: Chief Complaint  Patient presents with  . Left Knee - Pain    HPI: Sheena Trevino is a patient with left knee pain.  She is had pain for about the last 6 weeks.  She has [place] going [place]  She reports tenderness and warmth to touch.  She states is not really getting any better.  She tried a brace on the left-hand side without much relief.  She reports some pain predominantly on the lateral aspect of the knee.  She is using topical creams without much success.              ROS: All systems reviewed are negative as they relate to the chief complaint within the history of present illness.  Patient denies  fevers or chills.   Assessment & Plan: Visit Diagnoses:  1. Acute pain of left knee     Plan: Impression is left knee pain with predominantly patellofemoral arthritis exacerbated by going up and down the steps.  She is going to try ibuprofen about 1200 mg dose day total for 7 days only.  If that does not help I advised her to come back in and we will inject the knee.  Follow-up with me as needed.  No indication for further imaging at this time.  Follow-Up Instructions: No follow-ups on file.   Orders:  Orders Placed This Encounter  Procedures  . XR KNEE 3 VIEW LEFT   No orders of the defined types were placed in this encounter.     Procedures: No procedures performed   Clinical Data: No additional findings.  Objective: Vital Signs: There were no vitals taken for this visit.  Physical Exam:   Constitutional: Patient appears well-developed HEENT:  Head: Normocephalic Eyes:EOM are normal Neck: Normal range of motion Cardiovascular: Normal rate Pulmonary/chest: Effort normal  Neurologic: Patient is alert Skin: Skin is warm Psychiatric: Patient has normal mood and affect    Ortho Exam: Ortho exam demonstrates full active and passive range of motion of the left knee.  She does have a little bit of clicking laterally.  No effusion in the left knee.  Collateral crucial ligaments are stable.  No groin pain with internal X rotation of the leg.  Pedal pulses palpable.  No masses lymphadenopathy or skin changes noted in that left knee region.  Specialty Comments:  No specialty comments available.  Imaging: Xr Knee 3 View Left  Result Date: 07/06/2019 AP lateral merchant left knee reviewed.  There is no significant joint space narrowing.  Patellofemoral arthritis is present particularly between the lateral facet and lateral femoral condyle.  No loose bodies.  No fracture or dislocation.    PMFS History: Patient Active Problem List   Diagnosis Date Noted  . Right thyroid nodule 09/05/2014  . 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 Anemia    in late twenties  . Anxiety   . Arthritis    "knees, fingers" (09/05/2014)  . Diabetes mellitus without complication ([facility])   . GERD (gastroesophageal reflux disease)   .  H/O hiatal hernia   . Hyperlipidemia   . Hypertension   . Insomnia   . Irritable bowel    with stress  . Obesity   . PONV (postoperative nausea and vomiting)     Family History  Problem Relation Age of Onset  . Cancer Mother        lung  . Heart disease Father   . Breast cancer Neg Hx     Past Surgical History:  Procedure Laterality Date  . ABDOMINAL HYSTERECTOMY  1982   partial  . BREAST BIOPSY Left   . COLONOSCOPY    . FOOT SURGERY Right ~ 2004   "top of my foot"  . SHOULDER ARTHROSCOPY WITH OPEN ROTATOR CUFF REPAIR Right ~ 2000  . THYROID LOBECTOMY Right 09/05/2014  . THYROID LOBECTOMY Right 09/05/2014   Procedure: RIGHT THYROID LOBECTOMY;  Surgeon: Armandina Gemma, MD;   Location: [facility];  Service: General;  Laterality: Right;  . TUBAL LIGATION  1981   Social History   Occupational History  . Not on file  Tobacco Use  . Smoking status: Never Smoker  . Smokeless tobacco: Never Used  Substance and Sexual Activity  . Alcohol use: No  . Drug use: No  . Sexual activity: Not Currently

## 2019-08-01 DIAGNOSIS — E1169 Type 2 diabetes mellitus with other specified complication: Secondary | ICD-10-CM | POA: Diagnosis not present

## 2019-08-01 DIAGNOSIS — Z7984 Long term (current) use of oral hypoglycemic drugs: Secondary | ICD-10-CM | POA: Diagnosis not present

## 2019-08-01 DIAGNOSIS — E785 Hyperlipidemia, unspecified: Secondary | ICD-10-CM | POA: Diagnosis not present

## 2019-08-01 DIAGNOSIS — I10 Essential (primary) hypertension: Secondary | ICD-10-CM | POA: Diagnosis not present

## 2019-08-01 DIAGNOSIS — F411 Generalized anxiety disorder: Secondary | ICD-10-CM | POA: Diagnosis not present

## 2019-08-01 DIAGNOSIS — G4700 Insomnia, unspecified: Secondary | ICD-10-CM | POA: Diagnosis not present

## 2019-08-29 DIAGNOSIS — Z0000 Encounter for general adult medical examination without abnormal findings: Secondary | ICD-10-CM | POA: Diagnosis not present

## 2019-12-02 DIAGNOSIS — E119 Type 2 diabetes mellitus without complications: Secondary | ICD-10-CM | POA: Diagnosis not present

## 2019-12-02 DIAGNOSIS — H353133 Nonexudative age-related macular degeneration, bilateral, advanced atrophic without subfoveal involvement: Secondary | ICD-10-CM | POA: Diagnosis not present

## 2020-01-13 DIAGNOSIS — F411 Generalized anxiety disorder: Secondary | ICD-10-CM | POA: Diagnosis not present

## 2020-01-13 DIAGNOSIS — Z86010 Personal history of colonic polyps: Secondary | ICD-10-CM | POA: Diagnosis not present

## 2020-01-13 DIAGNOSIS — E041 Nontoxic single thyroid nodule: Secondary | ICD-10-CM | POA: Diagnosis not present

## 2020-01-13 DIAGNOSIS — E785 Hyperlipidemia, unspecified: Secondary | ICD-10-CM | POA: Diagnosis not present

## 2020-01-13 DIAGNOSIS — I10 Essential (primary) hypertension: Secondary | ICD-10-CM | POA: Diagnosis not present

## 2020-01-13 DIAGNOSIS — E1169 Type 2 diabetes mellitus with other specified complication: Secondary | ICD-10-CM | POA: Diagnosis not present

## 2020-01-30 ENCOUNTER — Ambulatory Visit: Payer: Medicare Other

## 2020-03-07 ENCOUNTER — Other Ambulatory Visit: Payer: Self-pay

## 2020-03-07 ENCOUNTER — Encounter: Payer: Self-pay | Admitting: Orthopedic Surgery

## 2020-03-07 ENCOUNTER — Ambulatory Visit (INDEPENDENT_AMBULATORY_CARE_PROVIDER_SITE_OTHER): Payer: Medicare Other

## 2020-03-07 ENCOUNTER — Ambulatory Visit (INDEPENDENT_AMBULATORY_CARE_PROVIDER_SITE_OTHER): Payer: Medicare Other | Admitting: Orthopedic Surgery

## 2020-03-07 DIAGNOSIS — M19011 Primary osteoarthritis, right shoulder: Secondary | ICD-10-CM

## 2020-03-07 DIAGNOSIS — M25511 Pain in right shoulder: Secondary | ICD-10-CM

## 2020-03-07 DIAGNOSIS — M12811 Other specific arthropathies, not elsewhere classified, right shoulder: Secondary | ICD-10-CM

## 2020-03-08 ENCOUNTER — Encounter: Payer: Self-pay | Admitting: Orthopedic Surgery

## 2020-03-08 DIAGNOSIS — M12811 Other specific arthropathies, not elsewhere classified, right shoulder: Secondary | ICD-10-CM

## 2020-03-08 DIAGNOSIS — M19011 Primary osteoarthritis, right shoulder: Secondary | ICD-10-CM | POA: Diagnosis not present

## 2020-03-08 MED ORDER — METHYLPREDNISOLONE ACETATE 40 MG/ML IJ SUSP
13.3300 mg | INTRAMUSCULAR | Status: AC | PRN
  Administered 2020-03-08: 13.33 mg via INTRA_ARTICULAR

## 2020-03-08 MED ORDER — METHYLPREDNISOLONE ACETATE 40 MG/ML IJ SUSP
40.0000 mg | INTRAMUSCULAR | Status: AC | PRN
  Administered 2020-03-08: 21:00:00 40 mg via INTRA_ARTICULAR

## 2020-03-08 MED ORDER — BUPIVACAINE HCL 0.5 % IJ SOLN
9.0000 mL | INTRAMUSCULAR | Status: AC | PRN
  Administered 2020-03-08: 21:00:00 9 mL via INTRA_ARTICULAR

## 2020-03-08 MED ORDER — BUPIVACAINE HCL 0.25 % IJ SOLN
0.6600 mL | INTRAMUSCULAR | Status: AC | PRN
  Administered 2020-03-08: 21:00:00 .66 mL via INTRA_ARTICULAR

## 2020-03-08 MED ORDER — LIDOCAINE HCL 1 % IJ SOLN
5.0000 mL | INTRAMUSCULAR | Status: AC | PRN
  Administered 2020-03-08: 21:00:00 5 mL

## 2020-03-08 MED ORDER — LIDOCAINE HCL 1 % IJ SOLN
3.0000 mL | INTRAMUSCULAR | Status: AC | PRN
  Administered 2020-03-08: 21:00:00 3 mL

## 2020-03-08 NOTE — Progress Notes (Signed)
Office Visit Note   Patient: Sheena Trevino           Date of Birth: 08-25-1946           MRN: [id number] Visit Date: 03/07/2020 Requested by: Kathyrn Lass, [place],  [place] [address] PCP: Kathyrn Lass, MD  Subjective: Chief Complaint  Patient presents with  . Right Shoulder - Pain    HPI: Kruth is a 74 year old patient with right shoulder pain.  She has had pain since January with no known injury.  Has a remote history of rotator cuff repair.  She reports some painful range of motion.  She reports some grinding and pain with internal and external rotation.  Does have some pain with any type of motion overhead.              ROS: All systems reviewed are negative as they relate to the chief complaint within the history of present illness.  Patient denies  fevers or chills.   Assessment & Plan: Visit Diagnoses:  1. Right shoulder pain, unspecified chronicity     Plan: Impression is right shoulder pain with rotator cuff arthropathy and 46-month history of symptoms.  She may also be having some symptoms related to her [facility] joint.  Plan today is both subacromial injection and AC joint injection.  We will see how she does with that.  I will think her symptoms are bad enough for reverse shoulder replacement yet but it could be coming to that.  We will see her back in about 8 weeks for clinical recheck.  Radiographs today do show evidence of rotator cuff arthropathy.  Follow-Up Instructions: Return if symptoms worsen or fail to improve.   Orders:  Orders Placed This Encounter  Procedures  . XR Shoulder Right   No orders of the defined types were placed in this encounter.     Procedures: Large Joint Inj on 03/08/2020 9:29 PM Indications: diagnostic evaluation and pain Details: 18 G 1.5 in needle, posterior approach  Arthrogram: No  Medications: 9 mL bupivacaine 0.5 %; 40 mg methylPREDNISolone acetate 40 MG/ML; 5 mL lidocaine 1 % Outcome: tolerated well, no  immediate complications Procedure, treatment alternatives, risks and benefits explained, specific risks discussed. Consent was given by the patient. Immediately prior to procedure a time out was called to verify the correct patient, procedure, equipment, support staff and site/side marked as required. Patient was prepped and draped in the usual sterile fashion.   Medium Joint Inj on 03/08/2020 9:29 PM Indications: diagnostic evaluation and pain Details: 25 G 1.5 in needle, ultrasound-guided superior approach Medications: 3 mL lidocaine 1 %; 0.66 mL bupivacaine 0.25 %; 13.33 mg methylPREDNISolone acetate 40 MG/ML Outcome: tolerated well, no immediate complications Procedure, treatment alternatives, risks and benefits explained, specific risks discussed. Consent was given by the patient. Immediately prior to procedure a time out was called to verify the correct patient, procedure, equipment, support staff and site/side marked as required. Patient was prepped and draped in the usual sterile fashion.       Clinical Data: No additional findings.  Objective: Vital Signs: There were no vitals taken for this visit.  Physical Exam:   Constitutional: Patient appears well-developed HEENT:  Head: Normocephalic Eyes:EOM are normal Neck: Normal range of motion Cardiovascular: Normal rate Pulmonary/chest: Effort normal Neurologic: Patient is alert Skin: Skin is warm Psychiatric: Patient has normal mood and affect    Ortho Exam: Ortho exam demonstrates some weakness to infraspinatus testing on the right-hand  side.  AC joint tenderness is present.  Does have forward flexion and abduction both above 90 degrees on the right.  Coarseness with internal/external rotation is noted.  No apprehension is present.  No masses lymphadenopathy or skin changes noted in the shoulder girdle region.  Deltoid is functional  Specialty Comments:  No specialty comments available.  Imaging: No results  found.   PMFS History: Patient Active Problem List   Diagnosis Date Noted  . Right thyroid nodule 09/05/2014  . 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 Anemia    in late twenties  . Anxiety   . Arthritis    "knees, fingers" (09/05/2014)  . Diabetes mellitus without complication ([facility])   . GERD (gastroesophageal reflux disease)   . H/O hiatal hernia   . Hyperlipidemia   . Hypertension   . Insomnia   . Irritable bowel    with stress  . Obesity   . PONV (postoperative nausea and vomiting)     Family History  Problem Relation Age of Onset  . Cancer Mother        lung  . Heart disease Father   . Breast cancer Neg Hx     Past Surgical History:  Procedure Laterality Date  . ABDOMINAL HYSTERECTOMY  1982   partial  . BREAST BIOPSY Left   . COLONOSCOPY    . FOOT SURGERY Right ~ 2004   "top of my foot"  . SHOULDER ARTHROSCOPY WITH OPEN ROTATOR CUFF REPAIR Right ~ 2000  . THYROID LOBECTOMY Right 09/05/2014  . THYROID LOBECTOMY Right 09/05/2014   Procedure: RIGHT THYROID LOBECTOMY;  Surgeon: Armandina Gemma, MD;  Location: [facility];  Service: General;  Laterality: Right;  . TUBAL LIGATION  1981   Social History   Occupational History  . Not on file  Tobacco Use  . Smoking status: Never Smoker  . Smokeless tobacco: Never Used  Substance and Sexual Activity  . Alcohol use: No  . Drug use: No  . Sexual activity: Not Currently

## 2020-06-18 ENCOUNTER — Other Ambulatory Visit: Payer: Self-pay | Admitting: Family Medicine

## 2020-06-18 DIAGNOSIS — Z1231 Encounter for screening mammogram for malignant neoplasm of breast: Secondary | ICD-10-CM

## 2020-06-22 ENCOUNTER — Ambulatory Visit
Admission: RE | Admit: 2020-06-22 | Discharge: 2020-06-22 | Disposition: A | Discharge status: Home / Self Care | Payer: Medicare Other | Source: Ambulatory Visit | Attending: Family Medicine | Admitting: Family Medicine

## 2020-06-22 ENCOUNTER — Other Ambulatory Visit: Payer: Self-pay

## 2020-06-22 DIAGNOSIS — Z1231 Encounter for screening mammogram for malignant neoplasm of breast: Secondary | ICD-10-CM

## 2020-07-05 ENCOUNTER — Ambulatory Visit: Payer: Medicare Other | Admitting: Orthopedic Surgery

## 2020-07-06 ENCOUNTER — Ambulatory Visit (INDEPENDENT_AMBULATORY_CARE_PROVIDER_SITE_OTHER): Payer: Medicare Other | Admitting: Orthopedic Surgery

## 2020-07-06 DIAGNOSIS — M12811 Other specific arthropathies, not elsewhere classified, right shoulder: Secondary | ICD-10-CM

## 2020-07-07 ENCOUNTER — Encounter: Payer: Self-pay | Admitting: Orthopedic Surgery

## 2020-07-07 DIAGNOSIS — M12811 Other specific arthropathies, not elsewhere classified, right shoulder: Secondary | ICD-10-CM | POA: Diagnosis not present

## 2020-07-07 MED ORDER — METHYLPREDNISOLONE ACETATE 40 MG/ML IJ SUSP
40.0000 mg | INTRAMUSCULAR | Status: AC | PRN
  Administered 2020-07-07: 40 mg via INTRA_ARTICULAR

## 2020-07-07 MED ORDER — BUPIVACAINE HCL 0.5 % IJ SOLN
9.0000 mL | INTRAMUSCULAR | Status: AC | PRN
  Administered 2020-07-07: 9 mL via INTRA_ARTICULAR

## 2020-07-07 MED ORDER — LIDOCAINE HCL 1 % IJ SOLN
5.0000 mL | INTRAMUSCULAR | Status: AC | PRN
  Administered 2020-07-07: 5 mL

## 2020-07-07 NOTE — Progress Notes (Signed)
Office Visit Note   Patient: Sheena Trevino           Date of Birth: [birth year]           MRN: [id number] Visit Date: 07/06/2020 Requested by: Kathyrn Lass, [place],  [place] [address] PCP: Kathyrn Lass, MD  Subjective: Chief Complaint  Patient presents with  . Right Shoulder - Pain    HPI: Tullius is a patient with known right shoulder rotator cuff arthropathy.  Old notes are reviewed.  She had a injection in March which was one half in the [facility] joint one half in the glenohumeral joint.  In general she reports more pain and loss of function.  Denies any fevers and chills or radicular symptoms.              ROS: All systems reviewed are negative as they relate to the chief complaint within the history of present illness.  Patient denies  fevers or chills.   Assessment & Plan: Visit Diagnoses:  1. Rotator cuff arthropathy of right shoulder     Plan: Impression is right shoulder rotator cuff arthropathy with less symptomatic AC joint today.  Plan is right shoulder glenohumeral joint injection.  I think we can repeat it in about 5 months.  She wants to avoid surgery and currently has pretty good function to do that.  We will see her back in 5 months.  Follow-Up Instructions: Return in about 5 months (around 12/06/2020).   Orders:  No orders of the defined types were placed in this encounter.  No orders of the defined types were placed in this encounter.     Procedures: Large Joint Inj: R glenohumeral on 07/07/2020 7:06 PM Indications: diagnostic evaluation and pain Details: 18 G 1.5 in needle, posterior approach  Arthrogram: No  Medications: 9 mL bupivacaine 0.5 %; 40 mg methylPREDNISolone acetate 40 MG/ML; 5 mL lidocaine 1 % Outcome: tolerated well, no immediate complications Procedure, treatment alternatives, risks and benefits explained, specific risks discussed. Consent was given by the patient. Immediately prior to procedure a time out was called to verify  the correct patient, procedure, equipment, support staff and site/side marked as required. Patient was prepped and draped in the usual sterile fashion.       Clinical Data: No additional findings.  Objective: Vital Signs: There were no vitals taken for this visit.  Physical Exam:   Constitutional: Patient appears well-developed HEENT:  Head: Normocephalic Eyes:EOM are normal Neck: Normal range of motion Cardiovascular: Normal rate Pulmonary/chest: Effort normal Neurologic: Patient is alert Skin: Skin is warm Psychiatric: Patient has normal mood and affect    Ortho Exam: Ortho exam demonstrates good cervical spine range of motion.  5 out of 5 grip EPL FPL interosseous wrist flexion extension bicep triceps and deltoid strength.  No masses lymphadenopathy or skin changes noted in that right shoulder girdle region.  She does have diminished external rotation strength on the right compared to the left.  She does have forward flexion above 90 degrees by about 10 to 15 degrees as well as about 80 degrees of isolated glenohumeral abduction.  No discrete AC joint tenderness today on exam.  Specialty Comments:  No specialty comments available.  Imaging: No results found.   PMFS History: Patient Active Problem List   Diagnosis Date Noted  . Right thyroid nodule 09/05/2014  . 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 Anemia  in late twenties  . Anxiety   . Arthritis    "knees, fingers" (09/05/2014)  . Diabetes mellitus without complication ([facility])   . GERD (gastroesophageal reflux disease)   . H/O hiatal hernia   . Hyperlipidemia   . Hypertension   . Insomnia   . Irritable bowel    with stress  . Obesity   . PONV (postoperative nausea and vomiting)     Family History  Problem Relation Age of Onset  . Cancer Mother        lung  . Heart disease Father   . Breast cancer Neg Hx     Past Surgical  History:  Procedure Laterality Date  . ABDOMINAL HYSTERECTOMY  1982   partial  . BREAST BIOPSY Left   . COLONOSCOPY    . FOOT SURGERY Right ~ 2004   "top of my foot"  . SHOULDER ARTHROSCOPY WITH OPEN ROTATOR CUFF REPAIR Right ~ 2000  . THYROID LOBECTOMY Right 09/05/2014  . THYROID LOBECTOMY Right 09/05/2014   Procedure: RIGHT THYROID LOBECTOMY;  Surgeon: Armandina Gemma, MD;  Location: [facility];  Service: General;  Laterality: Right;  . TUBAL LIGATION  1981   Social History   Occupational History  . Not on file  Tobacco Use  . Smoking status: Never Smoker  . Smokeless tobacco: Never Used  Substance and Sexual Activity  . Alcohol use: No  . Drug use: No  . Sexual activity: Not Currently

## 2020-08-31 DIAGNOSIS — Z0000 Encounter for general adult medical examination without abnormal findings: Secondary | ICD-10-CM | POA: Diagnosis not present

## 2020-08-31 DIAGNOSIS — Z79899 Other long term (current) drug therapy: Secondary | ICD-10-CM | POA: Diagnosis not present

## 2020-08-31 DIAGNOSIS — F411 Generalized anxiety disorder: Secondary | ICD-10-CM | POA: Diagnosis not present

## 2020-08-31 DIAGNOSIS — R0789 Other chest pain: Secondary | ICD-10-CM | POA: Diagnosis not present

## 2020-08-31 DIAGNOSIS — Z86010 Personal history of colonic polyps: Secondary | ICD-10-CM | POA: Diagnosis not present

## 2020-08-31 DIAGNOSIS — E785 Hyperlipidemia, unspecified: Secondary | ICD-10-CM | POA: Diagnosis not present

## 2020-08-31 DIAGNOSIS — I10 Essential (primary) hypertension: Secondary | ICD-10-CM | POA: Diagnosis not present

## 2020-08-31 DIAGNOSIS — E1169 Type 2 diabetes mellitus with other specified complication: Secondary | ICD-10-CM | POA: Diagnosis not present

## 2020-08-31 DIAGNOSIS — E669 Obesity, unspecified: Secondary | ICD-10-CM | POA: Diagnosis not present

## 2020-08-31 DIAGNOSIS — Z6831 Body mass index (BMI) 31.0-31.9, adult: Secondary | ICD-10-CM | POA: Diagnosis not present

## 2020-08-31 DIAGNOSIS — E890 Postprocedural hypothyroidism: Secondary | ICD-10-CM | POA: Diagnosis not present

## 2020-08-31 DIAGNOSIS — G4700 Insomnia, unspecified: Secondary | ICD-10-CM | POA: Diagnosis not present

## 2020-09-01 IMAGING — MG DIGITAL SCREENING BILATERAL MAMMOGRAM WITH TOMO AND CAD
8 series · 8 of 24 positions shown · non-contrast
Comparison: Previous exam(s).

CLINICAL DATA: Screening.

EXAM:
DIGITAL SCREENING BILATERAL MAMMOGRAM WITH TOMO AND CAD

[L CC synth-2D]
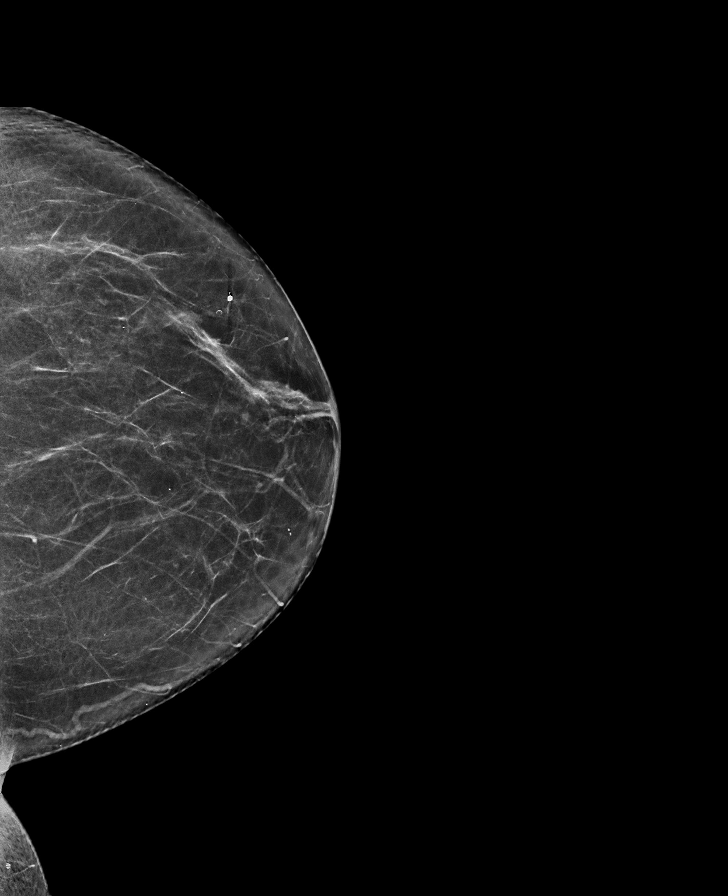

[L MLO synth-2D]
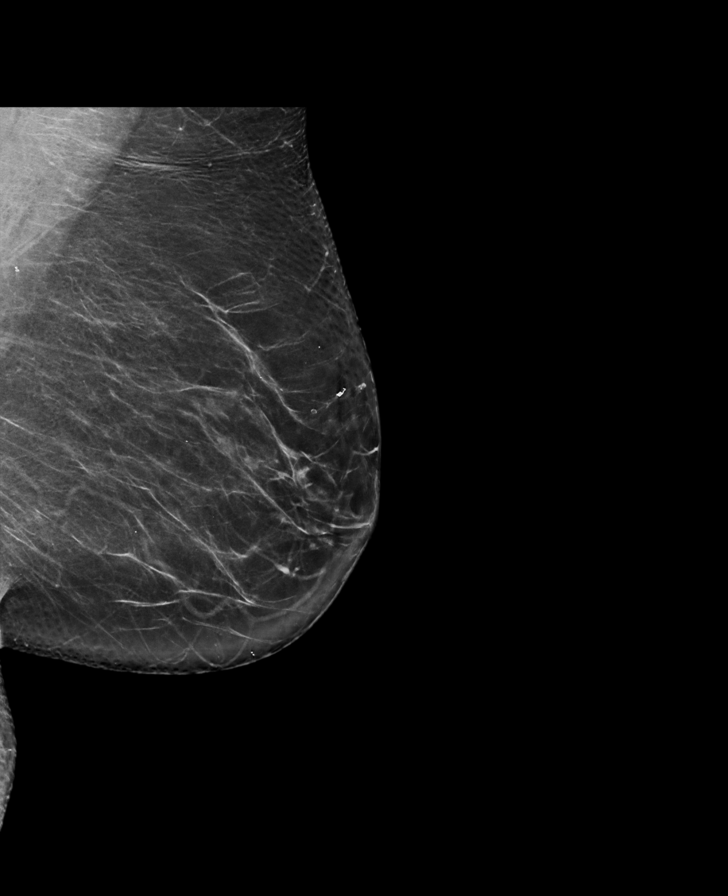

[R CC synth-2D]
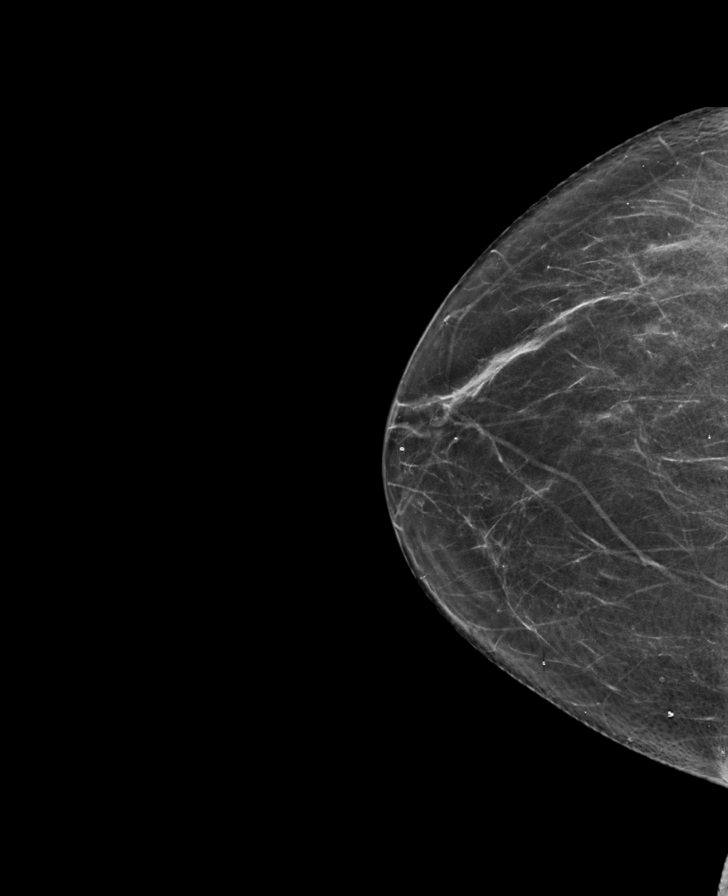

[R MLO synth-2D]
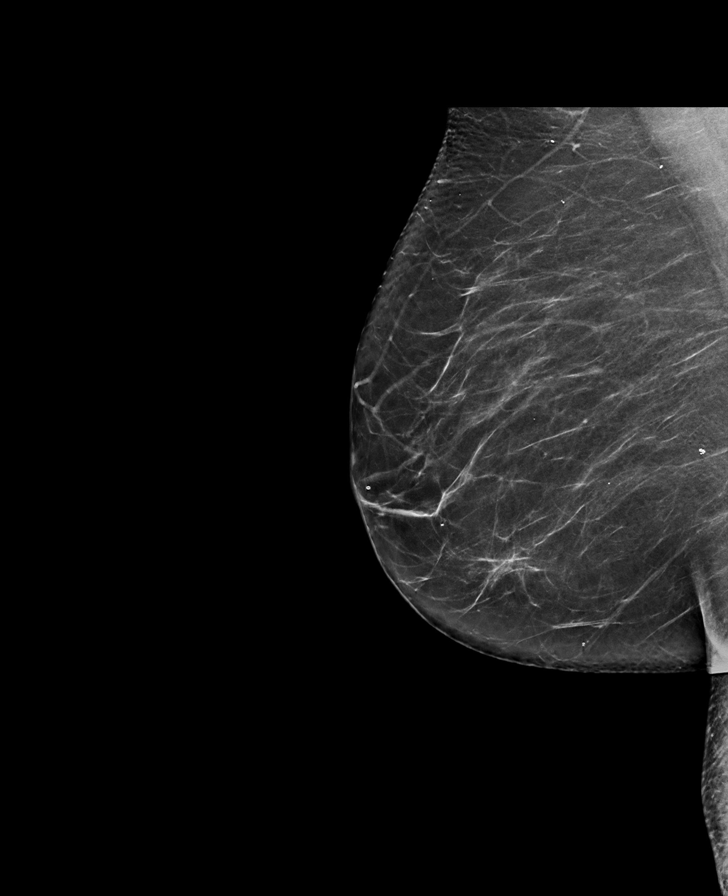

[R CC tomo · tomo slice 33/65.0]
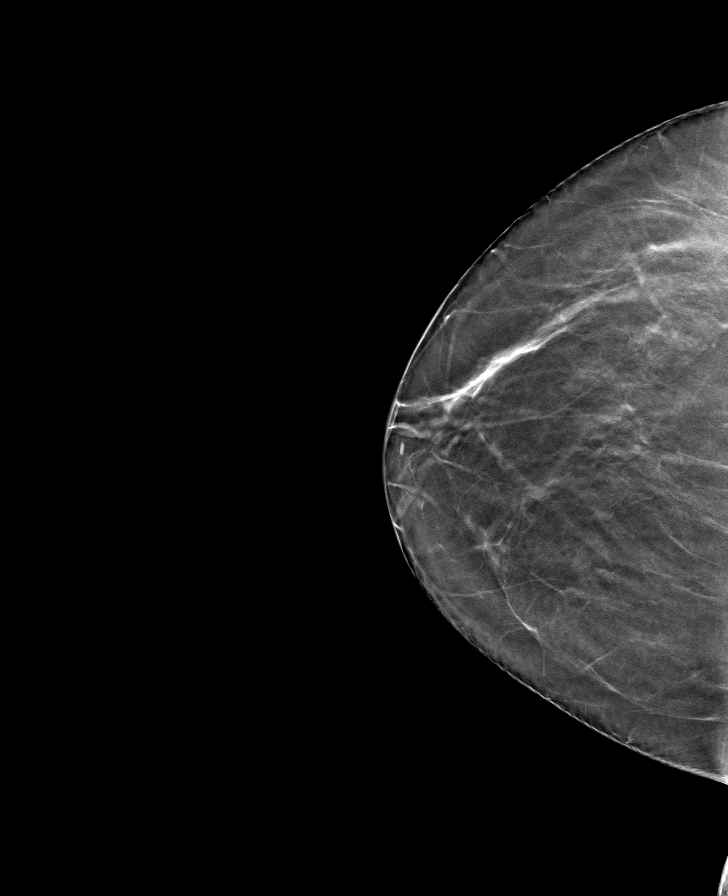

[L MLO tomo · tomo slice 40/79.0]
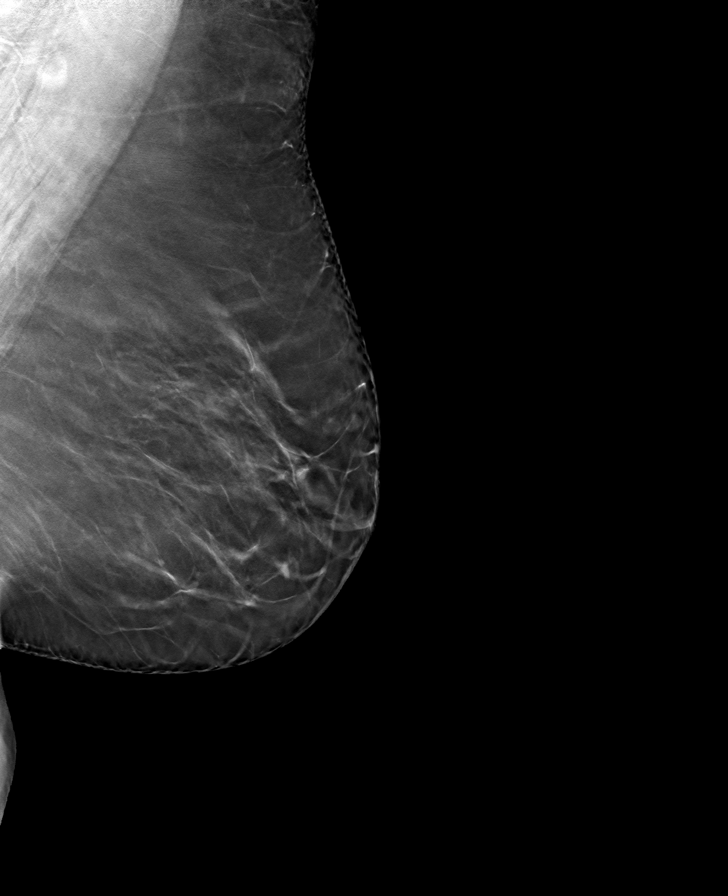

[R MLO tomo · tomo slice 39/76.0]
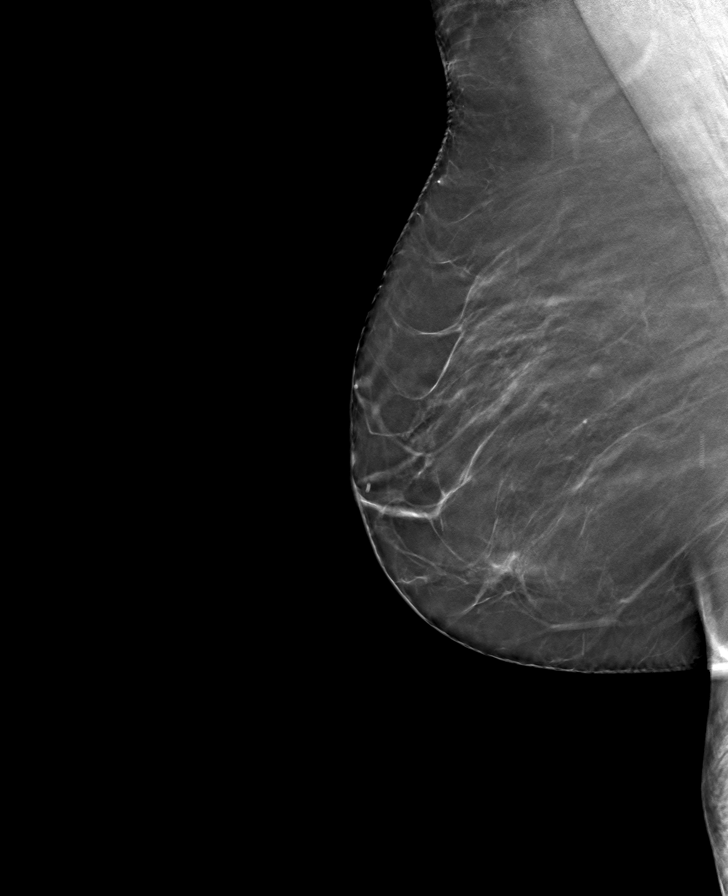

[L CC tomo · tomo slice 34/67.0]
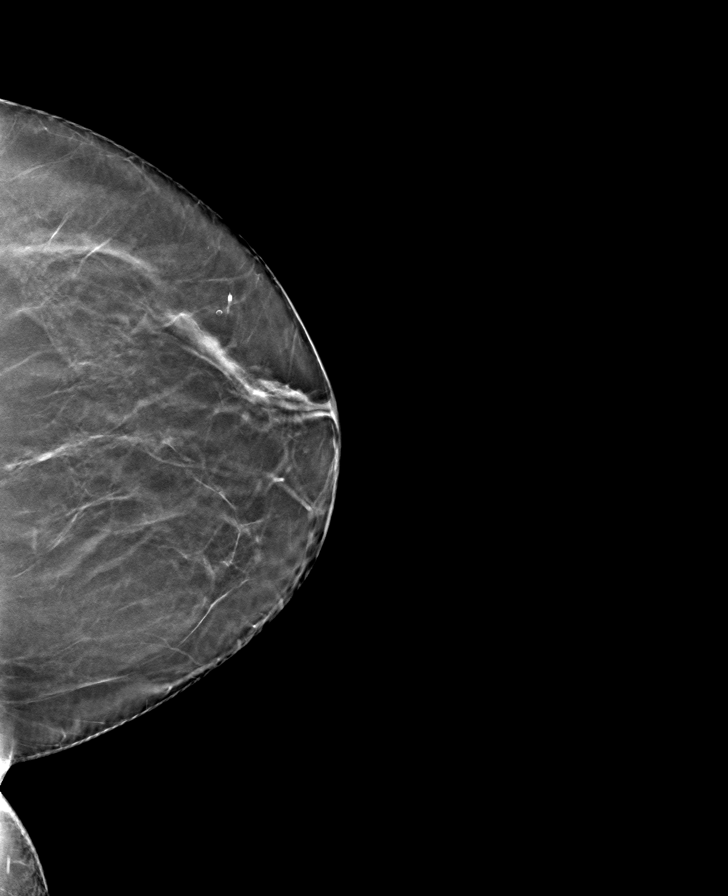

[8 of 24 positions shown; findings below may reference images not displayed]

ACR Breast Density Category b: There are scattered areas of
fibroglandular density.
FINDINGS: There are no findings suspicious for malignancy. Images were
processed with CAD.
IMPRESSION: No mammographic evidence of malignancy. A result letter of this
screening mammogram will be mailed directly to the patient.

RECOMMENDATION:
Screening mammogram in one year. (Code:[id number])

BI-RADS CATEGORY  1: Negative.

## 2020-10-03 ENCOUNTER — Encounter: Payer: Self-pay | Admitting: Cardiology

## 2020-10-03 ENCOUNTER — Ambulatory Visit (INDEPENDENT_AMBULATORY_CARE_PROVIDER_SITE_OTHER): Payer: Medicare Other | Admitting: Cardiology

## 2020-10-03 ENCOUNTER — Other Ambulatory Visit: Payer: Self-pay

## 2020-10-03 VITALS — BP 125/78 | HR 71 | Temp 95.2°F | Ht 63.5 in | Wt 180.8 lb

## 2020-10-03 DIAGNOSIS — E669 Obesity, unspecified: Secondary | ICD-10-CM

## 2020-10-03 DIAGNOSIS — R931 Abnormal findings on diagnostic imaging of heart and coronary circulation: Secondary | ICD-10-CM | POA: Diagnosis not present

## 2020-10-03 DIAGNOSIS — R079 Chest pain, unspecified: Secondary | ICD-10-CM

## 2020-10-03 DIAGNOSIS — Z7189 Other specified counseling: Secondary | ICD-10-CM

## 2020-10-03 DIAGNOSIS — R072 Precordial pain: Secondary | ICD-10-CM

## 2020-10-03 DIAGNOSIS — E785 Hyperlipidemia, unspecified: Secondary | ICD-10-CM

## 2020-10-03 DIAGNOSIS — Z01812 Encounter for preprocedural laboratory examination: Secondary | ICD-10-CM

## 2020-10-03 DIAGNOSIS — I10 Essential (primary) hypertension: Secondary | ICD-10-CM | POA: Diagnosis not present

## 2020-10-03 DIAGNOSIS — E1169 Type 2 diabetes mellitus with other specified complication: Secondary | ICD-10-CM

## 2020-10-03 MED ORDER — METOPROLOL TARTRATE 25 MG PO TABS: ORAL_TABLET | ORAL | 0 refills | Status: DC

## 2020-10-03 NOTE — Progress Notes (Signed)
Cardiology Office Note:    Date:  10/03/2020   ID:  Sheena Trevino, Sheena Trevino [birth year], MRN [id number]  PCP:  Kathyrn Lass, MD  Cardiologist:  Buford Dresser, MD  Referring MD: Kathyrn Lass, MD   CC: new patient evaluation for chest pain  History of Present Illness:    Sheena Trevino is a 74 y.o. female with a hx of hypertension, hyperlipidemia, type II diabetes, anxiety who is seen as a new consult at the request of Kathyrn Lass, MD for the evaluation and management of chest pain.  Reviewed note from Dr. Sabra Heck dated 08/31/20. Notes to have occasional chest pain, described as a heaviness, only at rest. Last occurred mid-July. Has separate right shoulder pain that improves with NSAIDs.  Chest pain: -Initial onset: notes about 1-1.5 year history -Quality: like someone is sitting on her chest, can't get enough oxygen in.  -Frequency: now happening a few times/week -Duration: less than 5 minutes -Associated symptoms: short of breath, no nausea/dizziness/diaphoresis -Aggravating/alleviating factors: resting makes it better, stress makes it worse. Husband is very stressed about Covid, she lost her brother to Covid last November. Any time of day but usually in the afternoon. Notices more when sitting still, has not limited her activity. -Prior cardiac history: none -Prior workup:none -Prior treatment: none -Alcohol/tobacco: never smoker, doesn't drink alcohol -Comorbidities: hypertension, hyperlipidemia, diet controlled type II diabetes -Diet: tries to eat healthy. Eliminated pasta, rice, potatoes -Exercise level: can do anything she needs to do, stays active around the house. Doesn't walk intentionally due to bone spurs, but no limitations -Cardiac ROS: no shortness of breath at rest, no PND, no orthopnea, no LE edema. Had previously had issues with low blood pressure over the winter, as low as 68/30, felt presyncopal at those times. Improved since blood pressure medication reduced. -Family  history: father had a history of heart disease. Heavy smoker and heavy drinker, died at around 73 years old of heart issue. Mother was a heavy smoker, didn't have prior heart issues but died instantly of a heart attack at age 72.  Tolerating simvastatin, had myalgia on atorvastatin. Started on simvastatin twice weekly, now up to 3-4 times/week since early September.  Past Medical History:  Diagnosis Date  . Anemia    in late twenties  . Anxiety   . Arthritis    "knees, fingers" (09/05/2014)  . Diabetes mellitus without complication ([place])   . GERD (gastroesophageal reflux disease)   . H/O hiatal hernia   . Hyperlipidemia   . Hypertension   . Insomnia   . Irritable bowel    with stress  . Obesity   . PONV (postoperative nausea and vomiting)     Past Surgical History:  Procedure Laterality Date  . ABDOMINAL HYSTERECTOMY  1982   partial  . BREAST BIOPSY Left   . COLONOSCOPY    . FOOT SURGERY Right ~ 2004   "top of my foot"  . SHOULDER ARTHROSCOPY WITH OPEN ROTATOR CUFF REPAIR Right ~ 2000  . THYROID LOBECTOMY Right 09/05/2014  . THYROID LOBECTOMY Right 09/05/2014   Procedure: RIGHT THYROID LOBECTOMY;  Surgeon: Armandina Gemma, MD;  Location: [facility];  Service: General;  Laterality: Right;  . TUBAL LIGATION  1981    Current Medications: Current Outpatient Medications on File Prior to Visit  Medication Sig  . aspirin EC 81 MG tablet Take 81 mg by mouth daily. Swallow whole.  . B Complex Vitamins (VITAMIN B COMPLEX PO) Take by mouth.  . cetirizine (ZYRTEC) 10 MG  tablet Take 10 mg by mouth daily.  . hydrochlorothiazide (HYDRODIURIL) 25 MG tablet Take 25 mg by mouth daily.  Marland Kitchen LORazepam (ATIVAN) 0.5 MG tablet Take 0.5 mg by mouth as needed for anxiety. Take 1 tablet at bedtime as needed for sleep or as needed for anxiety.  Marland Kitchen losartan (COZAAR) 100 MG tablet Take 100 mg by mouth daily.  . metFORMIN (GLUCOPHAGE) 500 MG tablet TAKE 1 TABLET BY MOUTH TWICE A DAY WITH A MEAL  . mometasone  (ELOCON) 0.1 % cream Apply 1 application topically daily as needed.  . Multiple Vitamin (MULTIVITAMIN) capsule Take by mouth.  . Multiple Vitamins-Minerals (MULTIVITAMIN WITH MINERALS) tablet Take 1 tablet by mouth daily.  Marland Kitchen omeprazole (PRILOSEC OTC) 20 MG tablet Take 20 mg by mouth daily.  . Probiotic Product (PROBIOTIC DAILY PO) Take 1 capsule by mouth daily.   . simvastatin (ZOCOR) 20 MG tablet Take 20 mg by mouth 2 (two) times a week.   . vitamin C (ASCORBIC ACID) 500 MG tablet Take 500 mg by mouth daily.  Marland Kitchen zolpidem (AMBIEN CR) 12.5 MG CR tablet Take 12.5 mg by mouth at bedtime as needed for sleep.    No current facility-administered medications on file prior to visit.     Allergies:   Codeine, Lipitor [atorvastatin], Pork-derived products, and Sulfa antibiotics   Social History   Tobacco Use  . Smoking status: Never Smoker  . Smokeless tobacco: Never Used  Substance Use Topics  . Alcohol use: No  . Drug use: No    Family History: family history includes Cancer in her mother; Diabetes in her sister; Heart attack in her father and paternal grandfather; Heart disease in her father; Leukemia in her brother. There is no history of Breast cancer.  ROS:   Please see the history of present illness.  Additional pertinent ROS: Constitutional: Negative for chills, fever, night sweats, unintentional weight loss  HENT: Negative for ear pain and hearing loss.   Eyes: Negative for loss of vision and eye pain.  Respiratory: Negative for cough, sputum, wheezing.   Cardiovascular: See HPI. Gastrointestinal: Negative for abdominal pain, melena, and hematochezia.  Genitourinary: Negative for dysuria and hematuria.  Musculoskeletal: Negative for falls and myalgias.  Skin: Negative for itching and rash.  Neurological: Negative for focal weakness, focal sensory changes and loss of consciousness.  Endo/Heme/Allergies: Does not bruise/bleed easily.     EKGs/Labs/Other Studies Reviewed:     The following studies were reviewed today: No prior cardiac studies available  EKG:  EKG is personally reviewed.  The ekg ordered today demonstrates NSR at 71 bpm with nonspecific NT pattern  Recent Labs: No results found for requested labs within last 8760 hours.  Recent Lipid Panel No results found for: CHOL, TRIG, HDL, CHOLHDL, VLDL, LDLCALC, LDLDIRECT  Physical Exam:    VS:  BP 125/78   Pulse 71   Temp (!) 95.2 F (35.1 C)   Ht 5' 3.5" (1.613 m)   Wt 180 lb 12.8 oz (82 kg)   SpO2 98%   BMI 31.52 kg/m     Wt Readings from Last 3 Encounters:  10/03/20 180 lb 12.8 oz (82 kg)  08/18/17 175 lb (79.4 kg)  09/05/14 194 lb 1.6 oz (88 kg)    GEN: Well nourished, well developed in no acute distress HEENT: Normal, moist mucous membranes NECK: No JVD CARDIAC: regular rhythm, normal S1 and S2, no rubs or gallops. No murmurs. VASCULAR: Radial and DP pulses 2+ bilaterally. No carotid bruits RESPIRATORY:  Clear to auscultation without rales, wheezing or rhonchi  ABDOMEN: Soft, non-tender, non-distended MUSCULOSKELETAL:  Ambulates independently SKIN: Warm and dry, no edema NEUROLOGIC:  Alert and oriented x 3. No focal neuro deficits noted. PSYCHIATRIC:  Normal affect    ASSESSMENT:    1. Precordial pain    2. Essential hypertension   3. Hyperlipidemia, unspecified hyperlipidemia type   4. Diabetes mellitus type 2 in obese ([place])   5. Pre-procedure lab exam   6. Chest pain of uncertain etiology   7. Abnormal findings on diagnostic imaging of heart and coronary circulation    8. Cardiac risk counseling   9. Counseling on health promotion and disease prevention    PLAN:    Chest pain, uncertain etiology: significant risk factors for CV disease including hypertension, hyperlipidemia, type II diabetes --discussed treadmill stress (needs imaging given diabetes), nuclear stress/lexiscan, and CT coronary angiography. Discussed pros and cons of each, including but not limited to  false positive/false negative risk, radiation risk, and risk of IV contrast dye. Based on shared decision making, decision was made to pursue CT coronary angiography. -will give one time dose of metoprolol 2 hours prior to scheduled test -counseled on need to get BMET prior to test. Last Cr per K[facility] was 0.84 -counseled on use of sublingual nitroglycerin and its importance to a good test -if CT abnormal, will send for FFR  Hypertension: -blood pressure at goal of <130/80 today -continue HCTZ, losartan  Hyperlipidemia: Per KPN dated 08/31/20 -Tchol 199, HDL 46, LDL 121, TG 181 -she is currently on simvastatin, recently increased to 4x/week. Did not tolerate atorvastatin. If CAD found on CT, would start low dose rosuvastatin instead and titrate up.  Type II diabetes: -last A1c per KPN 6.3 -BMI 31 -continue aspirin -statin as above -on metformin. If CAD found, would consider change to SGLT2i or GLP1RA.  Cardiac risk counseling and prevention recommendations: -recommend heart healthy/Mediterranean diet, with whole grains, fruits, vegetable, fish, lean meats, nuts, and olive oil. Limit salt. -recommend moderate walking, 3-5 times/week for 30-50 minutes each session. Aim for at least 150 minutes.week. Goal should be pace of 3 miles/hours, or walking 1.5 miles in 30 minutes -recommend avoidance of tobacco products. Avoid excess alcohol.  Plan for follow up: If CT cardiac unremarkable, follow up in 1 year  Buford Dresser, MD, PhD [place]  [facility]    Medication Adjustments/Labs and Tests Ordered: Current medicines are reviewed at length with the patient today.  Concerns regarding medicines are outlined above.  Orders Placed This Encounter  Procedures  . CT CORONARY MORPH W/CTA COR W/SCORE W/CA W/CM &/OR WO/CM  . CT CORONARY FRACTIONAL FLOW RESERVE DATA PREP  . CT CORONARY FRACTIONAL FLOW RESERVE FLUID ANALYSIS  . Basic metabolic panel  . EKG 12-Lead   Meds ordered this  encounter  Medications  . metoprolol tartrate (LOPRESSOR) 25 MG tablet    Sig: TAKE 1 TABLET 1 HR PRIOR TO CARDIAC PROCEDURE    Dispense:  1 tablet    Refill:  0    Patient Instructions  Medication Instructions:  Your Physician recommend you continue on your current medication as directed.    *If you need a refill on your cardiac medications before your next appointment, please call your pharmacy*   Lab Work: Your physician recommends that you return for lab work today 9 BMP).   If you have labs (blood work) drawn today and your tests are completely normal, you will receive your results only by: Marland Kitchen MyChart  Message (if you have MyChart) OR . A paper copy in the mail If you have any lab test that is abnormal or we need to change your treatment, we will call you to review the results.   Testing/Procedures: Cardiac CT Angiography (CTA), is a special type of CT scan that uses a computer to produce multi-dimensional views of major blood vessels throughout the body. In CT angiography, a contrast material is injected through an IV to help visualize the blood vessels [facility]  Follow-Up: At [facility], you and your health needs are our priority.  As part of our continuing mission to provide you with exceptional heart care, we have created designated Provider Care Teams.  These Care Teams include your primary Cardiologist (physician) and Advanced Practice Providers (APPs -  Physician Assistants and Nurse Practitioners) who all work together to provide you with the care you need, when you need it.  We recommend signing up for the patient portal called "MyChart".  Sign up information is provided on this After Visit Summary.  MyChart is used to connect with patients for Virtual Visits (Telemedicine).  Patients are able to view lab/test results, encounter notes, upcoming appointments, etc.  Non-urgent messages can be sent to your provider as well.   To learn more about what you can  do with MyChart, go to [email or web address].    Your next appointment:   1 year(s)  The format for your next appointment:   In Person  Provider:   Buford Dresser, MD  Your cardiac CT will be scheduled at one of the below locations:   [facility] [address] [place], [place] [address] [phone]  If scheduled at [facility], please arrive at the [facility] main entrance of [facility] 30 minutes prior to test start time. Proceed to the [facility] Radiology Department (first floor) to check-in and test prep.  If scheduled at [facility], please arrive 15 mins early for check-in and test prep.  Please follow these instructions carefully (unless otherwise directed):   On the Night Before the Test: . Be sure to Drink plenty of water. . Do not consume any caffeinated/decaffeinated beverages or chocolate 12 hours prior to your test. . Do not take any antihistamines 12 hours prior to your test.   On the Day of the Test: . Drink plenty of water. Do not drink any water within one hour of the test. . Do not eat any food 4 hours prior to the test. . You may take your regular medications prior to the test.  . Take metoprolol (Lopressor) 25 mg two hours prior to test. . HOLD Hydrochlorothiazide morning of the test. . FEMALES- please wear underwire-free bra if available        After the Test: . Drink plenty of water. . After receiving IV contrast, you may experience a mild flushed feeling. This is normal. . On occasion, you may experience a mild rash up to 24 hours after the test. This is not dangerous. If this occurs, you can take Benadryl 25 mg and increase your fluid intake. . If you experience trouble breathing, this can be serious. If it is severe call 911 IMMEDIATELY. If it is mild, please call our office. . If you take any of these medications: Glipizide/Metformin, Avandament, Glucavance, please do not take  48 hours after completing test unless otherwise instructed.   Once we have confirmed authorization from your insurance company, we will call you to set  up a date and time for your test. Based on how quickly your insurance processes prior authorizations requests, please allow up to 4 weeks to be contacted for scheduling your Cardiac CT appointment. Be advised that routine Cardiac CT appointments could be scheduled as many as 8 weeks after your provider has ordered it.  For non-scheduling related questions, please contact the cardiac imaging nurse navigator should you have any questions/concerns: [name], Cardiac Imaging Nurse Navigator Burley Saver, Interim Cardiac Imaging Nurse [place] and Vascular Services Direct Office Dial: [phone]   For scheduling needs, including cancellations and rescheduling, please call Vivien Rota at [phone], option 3.      Signed, Buford Dresser, MD PhD 10/03/2020 1:01 PM    [facility]

## 2020-10-03 NOTE — Patient Instructions (Addendum)
Medication Instructions:  Your Physician recommend you continue on your current medication as directed.    *If you need a refill on your cardiac medications before your next appointment, please call your pharmacy*   Lab Work: Your physician recommends that you return for lab work today 9 BMP).   If you have labs (blood work) drawn today and your tests are completely normal, you will receive your results only by: Marland Kitchen MyChart Message (if you have MyChart) OR . A paper copy in the mail If you have any lab test that is abnormal or we need to change your treatment, we will call you to review the results.   Testing/Procedures: Cardiac CT Angiography (CTA), is a special type of CT scan that uses a computer to produce multi-dimensional views of major blood vessels throughout the body. In CT angiography, a contrast material is injected through an IV to help visualize the blood vessels [facility]  Follow-Up: At [facility], you and your health needs are our priority.  As part of our continuing mission to provide you with exceptional heart care, we have created designated Provider Care Teams.  These Care Teams include your primary Cardiologist (physician) and Advanced Practice Providers (APPs -  Physician Assistants and Nurse Practitioners) who all work together to provide you with the care you need, when you need it.  We recommend signing up for the patient portal called "MyChart".  Sign up information is provided on this After Visit Summary.  MyChart is used to connect with patients for Virtual Visits (Telemedicine).  Patients are able to view lab/test results, encounter notes, upcoming appointments, etc.  Non-urgent messages can be sent to your provider as well.   To learn more about what you can do with MyChart, go to [email or web address].    Your next appointment:   1 year(s)  The format for your next appointment:   In Person  Provider:   Buford Dresser, MD  Your  cardiac CT will be scheduled at one of the below locations:   [facility] [address] [place], [place] [address] [phone]  If scheduled at [facility], please arrive at the [facility] main entrance of [facility] 30 minutes prior to test start time. Proceed to the [facility] Radiology Department (first floor) to check-in and test prep.  If scheduled at [facility], please arrive 15 mins early for check-in and test prep.  Please follow these instructions carefully (unless otherwise directed):   On the Night Before the Test: . Be sure to Drink plenty of water. . Do not consume any caffeinated/decaffeinated beverages or chocolate 12 hours prior to your test. . Do not take any antihistamines 12 hours prior to your test.   On the Day of the Test: . Drink plenty of water. Do not drink any water within one hour of the test. . Do not eat any food 4 hours prior to the test. . You may take your regular medications prior to the test.  . Take metoprolol (Lopressor) 25 mg two hours prior to test. . HOLD Hydrochlorothiazide morning of the test. . FEMALES- please wear underwire-free bra if available        After the Test: . Drink plenty of water. . After receiving IV contrast, you may experience a mild flushed feeling. This is normal. . On occasion, you may experience a mild rash up to 24 hours after the test. This is not dangerous. If this occurs, you can  take Benadryl 25 mg and increase your fluid intake. . If you experience trouble breathing, this can be serious. If it is severe call 911 IMMEDIATELY. If it is mild, please call our office. . If you take any of these medications: Glipizide/Metformin, Avandament, Glucavance, please do not take 48 hours after completing test unless otherwise instructed.   Once we have confirmed authorization from your insurance company, we will call you to set up a date and time for your test.  Based on how quickly your insurance processes prior authorizations requests, please allow up to 4 weeks to be contacted for scheduling your Cardiac CT appointment. Be advised that routine Cardiac CT appointments could be scheduled as many as 8 weeks after your provider has ordered it.  For non-scheduling related questions, please contact the cardiac imaging nurse navigator should you have any questions/concerns: [name], Cardiac Imaging Nurse Navigator Burley Saver, Interim Cardiac Imaging Nurse [place] and Vascular Services Direct Office Dial: [phone]   For scheduling needs, including cancellations and rescheduling, please call Vivien Rota at [phone], option 3.

## 2020-10-04 DIAGNOSIS — F411 Generalized anxiety disorder: Secondary | ICD-10-CM | POA: Diagnosis not present

## 2020-10-04 DIAGNOSIS — Z6832 Body mass index (BMI) 32.0-32.9, adult: Secondary | ICD-10-CM | POA: Diagnosis not present

## 2020-10-04 LAB — BASIC METABOLIC PANEL
BUN/Creatinine Ratio: 21 (ref 12–28)
BUN: 16 mg/dL (ref 8–27)
CO2: 26 mmol/L (ref 20–29)
Calcium: 10.2 mg/dL (ref 8.7–10.3)
Chloride: 95 mmol/L — ABNORMAL LOW (ref 96–106)
Creatinine, Ser: 0.78 mg/dL (ref 0.57–1.00)
GFR calc Af Amer: 87 mL/min/{1.73_m2} (ref >59)
GFR calc non Af Amer: 76 mL/min/{1.73_m2} (ref >59)
Glucose: 122 mg/dL — ABNORMAL HIGH (ref 65–99)
Potassium: 4 mmol/L (ref 3.5–5.2)
Sodium: 139 mmol/L (ref 134–144)

## 2020-10-17 ENCOUNTER — Telehealth (HOSPITAL_COMMUNITY): Payer: Self-pay | Admitting: Emergency Medicine

## 2020-10-17 NOTE — Telephone Encounter (Signed)
Reaching out to patient to offer assistance regarding upcoming cardiac imaging study; pt verbalizes understanding of appt date/time, parking situation and where to check in, pre-test NPO status and medications ordered, and verified current allergies; name and call back number provided for further questions should they arise Sheena Bond RN Navigator Cardiac Imaging [place] and Vascular [phone] office [phone] cell   Pt reminded to take po metoprolol 2 hr prior to scan and holding HCTZ the day of test. Pt verbalized understanding Sheena Trevino

## 2020-10-18 ENCOUNTER — Ambulatory Visit (HOSPITAL_COMMUNITY)
Admission: RE | Admit: 2020-10-18 | Discharge: 2020-10-18 | Disposition: A | Discharge status: Home / Self Care | Payer: Medicare Other | Source: Ambulatory Visit | Attending: Cardiology | Admitting: Cardiology

## 2020-10-18 ENCOUNTER — Other Ambulatory Visit: Payer: Self-pay

## 2020-10-18 DIAGNOSIS — R931 Abnormal findings on diagnostic imaging of heart and coronary circulation: Secondary | ICD-10-CM | POA: Insufficient documentation

## 2020-10-18 DIAGNOSIS — R072 Precordial pain: Secondary | ICD-10-CM

## 2020-10-18 DIAGNOSIS — R079 Chest pain, unspecified: Secondary | ICD-10-CM

## 2020-10-18 DIAGNOSIS — I2510 Atherosclerotic heart disease of native coronary artery without angina pectoris: Secondary | ICD-10-CM | POA: Diagnosis not present

## 2020-10-18 MED ORDER — NITROGLYCERIN 0.4 MG SL SUBL
SUBLINGUAL_TABLET | SUBLINGUAL | Status: AC
  Filled 2020-10-18: qty 2

## 2020-10-18 MED ORDER — IOHEXOL 350 MG/ML SOLN
80.0000 mL | Freq: Once | INTRAVENOUS | Status: AC | PRN
  Administered 2020-10-18: 80 mL via INTRAVENOUS

## 2020-10-18 MED ORDER — NITROGLYCERIN 0.4 MG SL SUBL
0.8000 mg | SUBLINGUAL_TABLET | Freq: Once | SUBLINGUAL | Status: AC
  Administered 2020-10-18: 0.8 mg via SUBLINGUAL

## 2020-11-14 ENCOUNTER — Ambulatory Visit (INDEPENDENT_AMBULATORY_CARE_PROVIDER_SITE_OTHER): Payer: Medicare Other | Admitting: Orthopedic Surgery

## 2020-11-14 ENCOUNTER — Ambulatory Visit (INDEPENDENT_AMBULATORY_CARE_PROVIDER_SITE_OTHER): Payer: Medicare Other

## 2020-11-14 ENCOUNTER — Encounter: Payer: Self-pay | Admitting: Orthopedic Surgery

## 2020-11-14 VITALS — Ht 63.5 in | Wt 178.0 lb

## 2020-11-14 DIAGNOSIS — M545 Low back pain, unspecified: Secondary | ICD-10-CM | POA: Diagnosis not present

## 2020-11-14 DIAGNOSIS — M25552 Pain in left hip: Secondary | ICD-10-CM

## 2020-11-15 ENCOUNTER — Telehealth: Payer: Self-pay | Admitting: Orthopedic Surgery

## 2020-11-15 NOTE — Telephone Encounter (Signed)
See below . Thanks

## 2020-11-15 NOTE — Telephone Encounter (Signed)
Pt called checking on her medication that was suppose to be sent in yesterday after her apt.   [facility]'t rec[facility] yet for her

## 2020-11-16 ENCOUNTER — Encounter: Payer: Self-pay | Admitting: Cardiology

## 2020-11-16 ENCOUNTER — Other Ambulatory Visit: Payer: Self-pay

## 2020-11-16 ENCOUNTER — Other Ambulatory Visit: Payer: Self-pay | Admitting: Surgical

## 2020-11-16 ENCOUNTER — Ambulatory Visit (INDEPENDENT_AMBULATORY_CARE_PROVIDER_SITE_OTHER): Payer: Medicare Other | Admitting: Cardiology

## 2020-11-16 VITALS — BP 158/82 | HR 69 | Ht 63.5 in | Wt 183.0 lb

## 2020-11-16 DIAGNOSIS — I2510 Atherosclerotic heart disease of native coronary artery without angina pectoris: Secondary | ICD-10-CM

## 2020-11-16 DIAGNOSIS — Z712 Person consulting for explanation of examination or test findings: Secondary | ICD-10-CM

## 2020-11-16 DIAGNOSIS — E1169 Type 2 diabetes mellitus with other specified complication: Secondary | ICD-10-CM | POA: Diagnosis not present

## 2020-11-16 DIAGNOSIS — R079 Chest pain, unspecified: Secondary | ICD-10-CM | POA: Diagnosis not present

## 2020-11-16 DIAGNOSIS — E669 Obesity, unspecified: Secondary | ICD-10-CM

## 2020-11-16 DIAGNOSIS — E7800 Pure hypercholesterolemia, unspecified: Secondary | ICD-10-CM | POA: Diagnosis not present

## 2020-11-16 DIAGNOSIS — I10 Essential (primary) hypertension: Secondary | ICD-10-CM | POA: Diagnosis not present

## 2020-11-16 DIAGNOSIS — Z7189 Other specified counseling: Secondary | ICD-10-CM | POA: Diagnosis not present

## 2020-11-16 MED ORDER — MELOXICAM 15 MG PO TABS: 15.0000 mg | ORAL_TABLET | Freq: Every day | ORAL | 2 refills | Status: DC

## 2020-11-16 MED ORDER — METHOCARBAMOL 500 MG PO TABS: 500.0000 mg | ORAL_TABLET | Freq: Three times a day (TID) | ORAL | 0 refills | Status: DC | PRN

## 2020-11-16 MED ORDER — ROSUVASTATIN CALCIUM 5 MG PO TABS: ORAL_TABLET | ORAL | 0 refills | Status: DC

## 2020-11-16 NOTE — Patient Instructions (Signed)
Medication Instructions:  Stop Simvastatin 20 mg   Start Rosuvastatin-Take 1 tablet (5 mg total) by mouth 2 (two) times a week for 14 days, THEN 1 tablet (5 mg total) 3 (three) times a week for 14 days, NEXT 1 tablet (5 mg total) 4 (four) times a week for 14 days, FINALLY 1 tablet (5 mg total) daily for 14 days    *If you need a refill on your cardiac medications before your next appointment, please call your pharmacy*   Lab Work: None   Testing/Procedures: None   Follow-Up: At [facility], you and your health needs are our priority.  As part of our continuing mission to provide you with exceptional heart care, we have created designated Provider Care Teams.  These Care Teams include your primary Cardiologist (physician) and Advanced Practice Providers (APPs -  Physician Assistants and Nurse Practitioners) who all work together to provide you with the care you need, when you need it.  We recommend signing up for the patient portal called "MyChart".  Sign up information is provided on this After Visit Summary.  MyChart is used to connect with patients for Virtual Visits (Telemedicine).  Patients are able to view lab/test results, encounter notes, upcoming appointments, etc.  Non-urgent messages can be sent to your provider as well.   To learn more about what you can do with MyChart, go to [email or web address].    Your next appointment:   4 month(s)  The format for your next appointment:   In Person  Provider:   Buford Dresser, MD

## 2020-11-16 NOTE — Progress Notes (Signed)
Cardiology Office Note:    Date:  11/16/2020   ID:  Sheena Trevino, Sheena Trevino 1946/12/29, MRN [id number]  PCP:  Kathyrn Lass, MD  Cardiologist:  Buford Dresser, MD  Referring MD: Kathyrn Lass, MD   CC: follow up test results  History of Present Illness:    Sheena Trevino is a 74 y.o. female with a hx of hypertension, hyperlipidemia, type II diabetes, anxiety who is seen for follow up today. I initially met her 10/03/20 as a new consult at the request of Kathyrn Lass, MD for the evaluation and management of chest pain.  CV history: Tolerating simvastatin, had myalgia on atorvastatin. Started on simvastatin twice weekly, now up to 3-4 times/week since early September.  Today: We reviewed her CT results. Mild CAD, nonobstructive based on FFR results. Reassuring that symptoms are unlikely to be ischemic in natures.  Continues to have intermittent chest discomfort. Completely random, nonextertional, not positional or related to food. She feels strongly that this is stress.  Did not tolerate atorvastatin. Tried to go up to 4 days/week of simvastatin, can't tolerate due to myalgias.  We discussed recommendations for management of her CAD. As she cannot tolerate atorvastatin or higher doses of simvastatin, will start slow ramp up of rosuvastatin. She will message with issues and overall tolerance at 8 weeks.  Denies shortness of breath at rest or with normal exertion. No PND, orthopnea, LE edema or unexpected weight gain. No syncope or palpitations.  Past Medical History:  Diagnosis Date  . Anemia    in late twenties  . Anxiety   . Arthritis    "knees, fingers" (09/05/2014)  . Diabetes mellitus without complication ([place])   . GERD (gastroesophageal reflux disease)   . H/O hiatal hernia   . Hyperlipidemia   . Hypertension   . Insomnia   . Irritable bowel    with stress  . Obesity   . PONV (postoperative nausea and vomiting)     Past Surgical History:  Procedure Laterality Date  .  ABDOMINAL HYSTERECTOMY  1982   partial  . BREAST BIOPSY Left   . COLONOSCOPY    . FOOT SURGERY Right ~ 2004   "top of my foot"  . SHOULDER ARTHROSCOPY WITH OPEN ROTATOR CUFF REPAIR Right ~ 2000  . THYROID LOBECTOMY Right 09/05/2014  . THYROID LOBECTOMY Right 09/05/2014   Procedure: RIGHT THYROID LOBECTOMY;  Surgeon: Armandina Gemma, MD;  Location: [facility];  Service: General;  Laterality: Right;  . TUBAL LIGATION  1981    Current Medications: Current Outpatient Medications on File Prior to Visit  Medication Sig  . aspirin EC 81 MG tablet Take 81 mg by mouth daily. Swallow whole.  . B Complex Vitamins (VITAMIN B COMPLEX PO) Take by mouth.  . cetirizine (ZYRTEC) 10 MG tablet Take 10 mg by mouth daily.  . hydrochlorothiazide (HYDRODIURIL) 25 MG tablet Take 25 mg by mouth daily.  Marland Kitchen levothyroxine (SYNTHROID) 88 MCG tablet Take 88 mcg by mouth daily.  Marland Kitchen LORazepam (ATIVAN) 0.5 MG tablet Take 0.5 mg by mouth as needed for anxiety. Take 1 tablet at bedtime as needed for sleep or as needed for anxiety.  Marland Kitchen losartan (COZAAR) 100 MG tablet Take 100 mg by mouth daily.  . metFORMIN (GLUCOPHAGE) 500 MG tablet TAKE 1 TABLET BY MOUTH TWICE A DAY WITH A MEAL  . metoprolol tartrate (LOPRESSOR) 25 MG tablet TAKE 1 TABLET 1 HR PRIOR TO CARDIAC PROCEDURE  . mometasone (ELOCON) 0.1 % cream Apply 1 application topically  daily as needed.  . Multiple Vitamin (MULTIVITAMIN) capsule Take by mouth.  . Multiple Vitamins-Minerals (MULTIVITAMIN WITH MINERALS) tablet Take 1 tablet by mouth daily.  Marland Kitchen omeprazole (PRILOSEC OTC) 20 MG tablet Take 20 mg by mouth daily.  . Probiotic Product (PROBIOTIC DAILY PO) Take 1 capsule by mouth daily.   . sertraline (ZOLOFT) 25 MG tablet Take 25 mg by mouth daily.  . simvastatin (ZOCOR) 20 MG tablet Take 20 mg by mouth 2 (two) times a week.   . vitamin C (ASCORBIC ACID) 500 MG tablet Take 500 mg by mouth daily.  Marland Kitchen zolpidem (AMBIEN CR) 12.5 MG CR tablet Take 12.5 mg by mouth at bedtime as  needed for sleep.    No current facility-administered medications on file prior to visit.     Allergies:   Codeine, Lipitor [atorvastatin], Pork-derived products, and Sulfa antibiotics   Social History   Tobacco Use  . Smoking status: Never Smoker  . Smokeless tobacco: Never Used  Substance Use Topics  . Alcohol use: No  . Drug use: No    Family History: family history includes Cancer in her mother; Diabetes in her sister; Heart attack in her father and paternal grandfather; Heart disease in her father; Leukemia in her brother. There is no history of Breast cancer. father had a history of heart disease. Heavy smoker and heavy drinker, died at around 73 years old of heart issue. Mother was a heavy smoker, didn't have prior heart issues but died instantly of a heart attack at age 48.  ROS:   Please see the history of present illness.  Additional pertinent ROS otherwise unremarkable.    EKGs/Labs/Other Studies Reviewed:    The following studies were reviewed today: CT cardiac 10/18/20 Coronary Arteries:  Normal coronary origin.  Right dominance.  Left main: The left main is a large caliber vessel with a normal take off from the left coronary cusp that trifurcates to form a left anterior descending artery, ramus intermedius, and a left circumflex artery. There is minimal non-calcified plaque (<25%).  Left anterior descending artery: The proximal LAD is diffusely diseased with mixed density plaque (25-49%). The mid LAD contains minimal calcified plaque (<25%). The distal LAD is patent. The LAD gives off 2 patent diagonal branches.  Ramus intermedius: Small vessel with no significant stenosis.  Left circumflex artery: The LCX is non-dominant. The proximal LCX contains minimal non-calcified plaque (<25%). The first OM branch contains minimal non-calcified plaque (<25%). The LCX terminates as a small OM branch that is patent.  Right coronary artery: The RCA is dominant  with normal take off from the right coronary cusp. The proximal RCA contains minimal calcified plaque (<25%). The mid RCA contains mild non-calcified plaque (25-49%). The distal RCA contains minimal calcified plaque (<25%). The RCA terminates as a PDA and right posterolateral branch without evidence of plaque or stenosis.   IMPRESSION: 1. Coronary calcium score of 121. This was 68th percentile for age and sex matched controls.  2. Normal coronary origin with right dominance.  3. Mild CAD in the proximal LAD (25-49%) and mid RCA (25-49%).  RECOMMENDATIONS: 1. CT FFR will be sent due to diffuse mild CAD in the proximal LAD. Consider preventive therapy and risk factor modification.  1. Left Main: 0.98; no significant stenosis.  2. Proximal LAD: 0.93; no significant stenosis. 3. Mid LAD: 0.88; no significant stenosis. 4. LCX: 0.94; no significant stenosis. 5. Proximal RCA: 0.95; no significant stenosis. 6. Mid RCA: 0.83; no significant stenosis.  IMPRESSION: 1.  CT FFR analysis didn't show any significant stenosis  EKG:  EKG is personally reviewed.  The ekg ordered 10/03/20 demonstrates NSR at 71 bpm with nonspecific NT pattern  Recent Labs: 10/03/2020: BUN 16; Creatinine, Ser 0.78; Potassium 4.0; Sodium 139  Recent Lipid Panel No results found for: CHOL, TRIG, HDL, CHOLHDL, VLDL, LDLCALC, LDLDIRECT  Physical Exam:    VS:  BP (!) 158/82   Pulse 69   Ht 5' 3.5" (1.613 m)   Wt 183 lb (83 kg)   SpO2 98%   BMI 31.91 kg/m     Wt Readings from Last 3 Encounters:  11/16/20 183 lb (83 kg)  11/14/20 178 lb (80.7 kg)  10/03/20 180 lb 12.8 oz (82 kg)    GEN: Well nourished, well developed in no acute distress HEENT: Normal, moist mucous membranes NECK: No JVD CARDIAC: regular rhythm, normal S1 and S2, no rubs or gallops. No murmur. VASCULAR: Radial and DP pulses 2+ bilaterally. No carotid bruits RESPIRATORY:  Clear to auscultation without rales, wheezing or rhonchi   ABDOMEN: Soft, non-tender, non-distended MUSCULOSKELETAL:  Ambulates independently SKIN: Warm and dry, no edema NEUROLOGIC:  Alert and oriented x 3. No focal neuro deficits noted. PSYCHIATRIC:  Normal affect    ASSESSMENT:    1. Nonobstructive atherosclerosis of coronary artery   2. Chest pain of uncertain etiology   3. Essential hypertension   4. Pure hypercholesterolemia   5. Diabetes mellitus type 2 in obese (HCC)   6. Cardiac risk counseling   7. Counseling on health promotion and disease prevention   8. Encounter to discuss test results    PLAN:    Chest pain -CT cardiac and FFR shows mild CAD, but no obstructive lesions -suggests pain is not ischemic in origin -counseled on red flag warning signs that need immediate medical attention  Nonobstructive CAD: -she is already on aspirin 81 mg -she did not tolerate atorvastatin and could not tolerate more frequent simvastatin -we will try gradual ramp up of rosuvastatin, instructions given today  Hypertension: -blood pressure elevated today, goal of <130/80 -reports typically well controlled -if home numbers elevated, she will contact me -continue HCTZ, losartan  Hyperlipidemia: Per [facility] dated 08/31/20 -Tchol 199, HDL 46, LDL 121, TG 181 -changing to rosuvastatin as above; recheck lipids at follow up  Type II diabetes: -last A1c per [facility] 6.3 -BMI 31 -continue aspirin -statin as above -on metformin. Will change one medication at a time, discuss SGLT2i or GLP1RA at follow up.  Cardiac risk counseling and prevention recommendations: -recommend heart healthy/Mediterranean diet, with whole grains, fruits, vegetable, fish, lean meats, nuts, and olive oil. Limit salt. -recommend moderate walking, 3-5 times/week for 30-50 minutes each session. Aim for at least 150 minutes.week. Goal should be pace of 3 miles/hours, or walking 1.5 miles in 30 minutes -recommend avoidance of tobacco products. Avoid excess alcohol.  Plan for  follow up: 4 mos  Buford Dresser, MD, PhD [place]  [facility]    Medication Adjustments/Labs and Tests Ordered: Current medicines are reviewed at length with the patient today.  Concerns regarding medicines are outlined above.  No orders of the defined types were placed in this encounter.  Meds ordered this encounter  Medications  . DISCONTD: rosuvastatin (CRESTOR) 5 MG tablet    Sig: Take 1 tablet (5 mg total) by mouth 2 (two) times a week for 14 days, THEN 1 tablet (5 mg total) 3 (three) times a week for 14 days, THEN 1 tablet (5 mg total) 4 (  four) times a week for 14 days, THEN 1 tablet (5 mg total) daily for 14 days.    Dispense:  32 tablet    Refill:  0    Patient Instructions  Medication Instructions:  Stop Simvastatin 20 mg   Start Rosuvastatin-Take 1 tablet (5 mg total) by mouth 2 (two) times a week for 14 days, THEN 1 tablet (5 mg total) 3 (three) times a week for 14 days, NEXT 1 tablet (5 mg total) 4 (four) times a week for 14 days, FINALLY 1 tablet (5 mg total) daily for 14 days    *If you need a refill on your cardiac medications before your next appointment, please call your pharmacy*   Lab Work: None   Testing/Procedures: None   Follow-Up: At [facility], you and your health needs are our priority.  As part of our continuing mission to provide you with exceptional heart care, we have created designated Provider Care Teams.  These Care Teams include your primary Cardiologist (physician) and Advanced Practice Providers (APPs -  Physician Assistants and Nurse Practitioners) who all work together to provide you with the care you need, when you need it.  We recommend signing up for the patient portal called "MyChart".  Sign up information is provided on this After Visit Summary.  MyChart is used to connect with patients for Virtual Visits (Telemedicine).  Patients are able to view lab/test results, encounter notes, upcoming appointments, etc.   Non-urgent messages can be sent to your provider as well.   To learn more about what you can do with MyChart, go to [email or web address].    Your next appointment:   4 month(s)  The format for your next appointment:   In Person  Provider:   Buford Dresser, MD       Signed, Buford Dresser, MD PhD 11/16/2020   [place]

## 2020-11-18 ENCOUNTER — Encounter: Payer: Self-pay | Admitting: Orthopedic Surgery

## 2020-11-18 NOTE — Progress Notes (Signed)
Office Visit Note   Patient: Sheena Trevino           Date of Birth: [birth year]           MRN: [id number] Visit Date: 11/14/2020 Requested by: Kathyrn Lass, [place],  [place] [address] PCP: Kathyrn Lass, MD  Subjective: Chief Complaint  Patient presents with  . Lower Back - Pain  . Left Hip - Pain    HPI: Sheena Trevino is a 74 y.o. female who presents to the office complaining of left hip pain for 1 month. Patient denies any injury leading to onset of pain recently. She notes pain around the left buttocks and denies groin pain. She notes occasional radiation of pain down her lateral thigh and calf and stops at her ankle. Pain is worse with walking. Denies any numbness or tingling, weakness of the leg. She does note is hard to sleep on the left side. Pain is waking her up 2 out of 7 nights per week. Advil helps a bit. Denies any history of back or hip surgery. Denies any red flag symptoms such as bowel/bladder incontinence or saddle anesthesia. She has had similar episodes of this pain in the past that have resolved spontaneously..                ROS: All systems reviewed are negative as they relate to the chief complaint within the history of present illness.  Patient denies fevers or chills.  Assessment & Plan: Visit Diagnoses:  1. Acute left-sided low back pain, unspecified whether sciatica present   2. Pain in left hip     Plan:. Patient is a 74 year old female presents complaint of left hip pain. She has had pain that she localizes to the buttocks with occasional radiation down the lateral leg to the left ankle. Symptoms are worse with walking. She has had similar episodes in the past that have resolved on their own. No history of back or hip surgery. No weakness on exam today. Radiographs taken today of the left hip is unremarkable. Lumbar spine radiographs reveal mild scoliosis with loss of lordosis and mild to moderate degenerative changes throughout the lumbar  spine. Plan to try 4-week course of Robaxin and meloxicam with follow-up in 4 weeks to determine whether or not to order MRI scan at that time. Patient agreed with plan. Follow-up in 4 weeks.  Follow-Up Instructions: No follow-ups on file.   Orders:  Orders Placed This Encounter  Procedures  . XR Pelvis 1-2 Views  . XR Lumbar Spine 2-3 Views   No orders of the defined types were placed in this encounter.     Procedures: No procedures performed   Clinical Data: No additional findings.  Objective: Vital Signs: Ht 5' 3.5" (1.613 m)   Wt 178 lb (80.7 kg)   BMI 31.04 kg/m   Physical Exam:  Constitutional: Patient appears well-developed HEENT:  Head: Normocephalic Eyes:EOM are normal Neck: Normal range of motion Cardiovascular: Normal rate Pulmonary/chest: Effort normal Neurologic: Patient is alert Skin: Skin is warm Psychiatric: Patient has normal mood and affect  Ortho Exam: Ortho exam demonstrates left hip with no significant tenderness over the greater trochanter. Mild tenderness along the paraspinal musculature to the left of the lumbar spine. No tenderness along the lumbar spine. Negative straight leg raise. 5/5 motor strength of the bilateral hip flexors, quadriceps, hamstring, dorsiflexion, plantarflexion. No evidence of clonus. Sensation intact to all dermatomes of bilateral lower extremities. No pain with hip  range of motion. Negative Stinchfield exam  Specialty Comments:  No specialty comments available.  Imaging: No results found.   PMFS History: Patient Active Problem List   Diagnosis Date Noted  . Right thyroid nodule 09/05/2014  . 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 Anemia    in late twenties  . Anxiety   . Arthritis    "knees, fingers" (09/05/2014)  . Diabetes mellitus without complication ([place])   . GERD (gastroesophageal reflux disease)   . H/O hiatal hernia   .  Hyperlipidemia   . Hypertension   . Insomnia   . Irritable bowel    with stress  . Obesity   . PONV (postoperative nausea and vomiting)     Family History  Problem Relation Age of Onset  . Cancer Mother        lung  . Heart disease Father   . Heart attack Father   . Diabetes Sister   . Leukemia Brother   . Heart attack Paternal Grandfather   . Breast cancer Neg Hx     Past Surgical History:  Procedure Laterality Date  . ABDOMINAL HYSTERECTOMY  1982   partial  . BREAST BIOPSY Left   . COLONOSCOPY    . FOOT SURGERY Right ~ 2004   "top of my foot"  . SHOULDER ARTHROSCOPY WITH OPEN ROTATOR CUFF REPAIR Right ~ 2000  . THYROID LOBECTOMY Right 09/05/2014  . THYROID LOBECTOMY Right 09/05/2014   Procedure: RIGHT THYROID LOBECTOMY;  Surgeon: Armandina Gemma, MD;  Location: [facility];  Service: General;  Laterality: Right;  . TUBAL LIGATION  1981   Social History   Occupational History  . Not on file  Tobacco Use  . Smoking status: Never Smoker  . Smokeless tobacco: Never Used  Substance and Sexual Activity  . Alcohol use: No  . Drug use: No  . Sexual activity: Not Currently

## 2020-12-04 ENCOUNTER — Other Ambulatory Visit: Payer: Self-pay | Admitting: Surgical

## 2020-12-04 ENCOUNTER — Telehealth: Payer: Self-pay

## 2020-12-04 MED ORDER — METHOCARBAMOL 500 MG PO TABS
500.0000 mg | ORAL_TABLET | Freq: Three times a day (TID) | ORAL | 0 refills | Status: DC | PRN
Start: 2020-12-04 — End: 2021-03-26

## 2020-12-04 NOTE — Telephone Encounter (Signed)
Submitted refill

## 2020-12-04 NOTE — Telephone Encounter (Signed)
Patient called she stated Dr.Dean prescribed her methocarbamol she stated there are no refills she wants to know if she needs to follow up in the office or can he go ahead and send the refill to the pharmacy. CB:[phone]

## 2020-12-04 NOTE — Telephone Encounter (Signed)
I called patient and advised. 

## 2020-12-04 NOTE — Telephone Encounter (Signed)
See below

## 2020-12-27 DIAGNOSIS — G4700 Insomnia, unspecified: Secondary | ICD-10-CM | POA: Diagnosis not present

## 2020-12-27 DIAGNOSIS — I10 Essential (primary) hypertension: Secondary | ICD-10-CM | POA: Diagnosis not present

## 2020-12-27 DIAGNOSIS — M1990 Unspecified osteoarthritis, unspecified site: Secondary | ICD-10-CM | POA: Diagnosis not present

## 2020-12-27 DIAGNOSIS — E1169 Type 2 diabetes mellitus with other specified complication: Secondary | ICD-10-CM | POA: Diagnosis not present

## 2020-12-27 DIAGNOSIS — E890 Postprocedural hypothyroidism: Secondary | ICD-10-CM | POA: Diagnosis not present

## 2020-12-27 DIAGNOSIS — E785 Hyperlipidemia, unspecified: Secondary | ICD-10-CM | POA: Diagnosis not present

## 2021-01-23 ENCOUNTER — Telehealth: Payer: Self-pay | Admitting: Cardiology

## 2021-01-23 DIAGNOSIS — I2510 Atherosclerotic heart disease of native coronary artery without angina pectoris: Secondary | ICD-10-CM

## 2021-01-23 MED ORDER — ROSUVASTATIN CALCIUM 5 MG PO TABS: 5.0000 mg | ORAL_TABLET | Freq: Every day | ORAL | 2 refills | Status: AC

## 2021-01-23 NOTE — Telephone Encounter (Signed)
*  STAT* If patient is at the pharmacy, call can be transferred to refill team.   1. Which medications need to be refilled? (please list name of each medication and dose if known) rosuvastatin   2. Which pharmacy/location (including street and city if local pharmacy) is medication to be sent to? [facility]  3. Do they need a 30 day or 90 day supply? She gave her 18 patient not sure.

## 2021-01-23 NOTE — Telephone Encounter (Signed)
Can you please verify pt dose on crestor

## 2021-01-23 NOTE — Telephone Encounter (Signed)
Based on last OV on 11/19, pt should now be taking Rosuvastatin 5 mg daily.

## 2021-01-23 NOTE — Telephone Encounter (Signed)
Rx has been sent to the pharmacy electronically. Crestor 5 mg daily #90 2 refills

## 2021-02-09 ENCOUNTER — Other Ambulatory Visit: Payer: Self-pay | Admitting: Surgical

## 2021-03-10 ENCOUNTER — Encounter: Payer: Self-pay | Admitting: Cardiology

## 2021-03-11 ENCOUNTER — Telehealth: Payer: Self-pay | Admitting: Cardiology

## 2021-03-11 DIAGNOSIS — Z79899 Other long term (current) drug therapy: Secondary | ICD-10-CM

## 2021-03-11 NOTE — Telephone Encounter (Signed)
Spoke with the patient who states that she has been taking rosuvastatin 5 mg daily and has been tolerating it very well. She has cancelled her follow up appointment with Dr. Harrell Gave. She would like to know whether she needs to have lab work checked to make sure that the medication is working. Advised the patient that I would check with Dr. Harrell Gave for lab orders and we would let her know.

## 2021-03-11 NOTE — Telephone Encounter (Signed)
Sheena Trevino is calling due to having to cancel her appt with Dr. Harrell Gave due to a conflict. She is wanting to know if it is necessary for her to reschedule. She states she was under the impression she didn't need to comeback unless her cholesterol was not regulated by the change in medication. She states she can come in for labs to be sure if need be. Please advise.

## 2021-03-13 ENCOUNTER — Ambulatory Visit: Payer: Medicare Other | Admitting: Cardiology

## 2021-03-22 ENCOUNTER — Other Ambulatory Visit: Payer: Self-pay | Admitting: Surgical

## 2021-03-22 ENCOUNTER — Telehealth: Payer: Self-pay

## 2021-03-22 NOTE — Telephone Encounter (Signed)
Please advise 

## 2021-03-22 NOTE — Telephone Encounter (Signed)
I sent in RX for mobic with two refills in Feb. Try taking that or if that doesn't help, recommend make an appointment for evaluation and possible injection

## 2021-03-22 NOTE — Telephone Encounter (Signed)
Patient called she stated she hurt her knee while she was out of town she stated she thinks her knee is inflamed and she is requesting rx for inflammatory medication to be sent to the pharmacy call back:[phone]

## 2021-03-26 ENCOUNTER — Ambulatory Visit (INDEPENDENT_AMBULATORY_CARE_PROVIDER_SITE_OTHER): Payer: Medicare Other

## 2021-03-26 ENCOUNTER — Ambulatory Visit (INDEPENDENT_AMBULATORY_CARE_PROVIDER_SITE_OTHER): Payer: Medicare Other | Admitting: Family

## 2021-03-26 DIAGNOSIS — M25562 Pain in left knee: Secondary | ICD-10-CM

## 2021-03-26 MED ORDER — METHOCARBAMOL 500 MG PO TABS: 500.0000 mg | ORAL_TABLET | Freq: Three times a day (TID) | ORAL | 0 refills | Status: DC | PRN

## 2021-03-26 MED ORDER — PREDNISONE 50 MG PO TABS
ORAL_TABLET | ORAL | 0 refills | Status: DC
Start: 2021-03-26 — End: 2021-06-03

## 2021-03-26 NOTE — Progress Notes (Signed)
Office Visit Note   Patient: Sheena Trevino           Date of Birth: 1946/04/26           MRN: [id number] Visit Date: 03/26/2021              Requested by: Kathyrn Lass, [place],  [place] [address] PCP: Kathyrn Lass, MD  Chief Complaint  Patient presents with  . Left Knee - Pain      HPI: The patient is a 75 year old woman who presents today complaining of left knee pain.  She noticed this started last Tuesday.  She was out of town and [employer] home.  States she thinks the pain came on with ascending and descending the stairs multiple times a day.  They also had an issue with planning she was kneeling and stooping quite a bit that week.  Does not relate this to a specific injury  Having medial and lateral knee pain.  She has not noticed any swelling she has increased pain with ambulation.  Difficulty sleeping due to pain some pain radiating from her knee down to her ankle.  She states this feels like nerve pain.  No locking or catching no giving way.  Does notice increase in her pain with start up  Assessment & Plan: Visit Diagnoses:  1. Acute pain of left knee     Plan: Impression patellar tendinitis with some lumbar radiculopathy on the left.  We will trial her on a prednisone burst.  She will follow-up in the office as needed we will consider Depo-Medrol injection if still having knee pain.  She will continue with her Voltaren and Advil gels  Follow-Up Instructions: Return in about 2 weeks (around 04/09/2021), or if symptoms worsen or fail to improve.   Left Knee Exam   Muscle Strength  The patient has normal left knee strength.  Tenderness  The patient is experiencing tenderness in the patellar tendon and medial joint line.  Range of Motion  The patient has normal left knee ROM.  Tests  Varus: negative Valgus: negative  Other  Swelling: none   Back Exam   Tests  Straight leg raise left: positive      Patient is alert, oriented, no  adenopathy, well-dressed, normal affect, normal respiratory effort.   Imaging: XR Knee 1-2 Views Left  Result Date: 03/26/2021 Radiographs of left knee show mild joint space narrowing. No acute finding.   No images are attached to the encounter.  Labs: No results found for: HGBA1C, ESRSEDRATE, CRP, LABURIC, REPTSTATUS, GRAMSTAIN, CULT, LABORGA   No results found for: ALBUMIN, PREALBUMIN, CBC  No results found for: MG No results found for: VD25OH  No results found for: PREALBUMIN CBC EXTENDED Latest Ref Rng & Units 09/05/2014  WBC 4.0 - 10.5 K/uL 8.8  RBC 3.87 - 5.11 MIL/uL 4.53  HGB 12.0 - 15.0 g/dL 14.1  HCT 36.0 - 46.0 % 40.4  PLT 150 - 400 K/uL 236     There is no height or weight on file to calculate BMI.  Orders:  Orders Placed This Encounter  Procedures  . XR Knee 1-2 Views Left   Meds ordered this encounter  Medications  . predniSONE (DELTASONE) 50 MG tablet    Sig: Take one tablet by mouth once daily for 5 days.    Dispense:  5 tablet    Refill:  0  . methocarbamol (ROBAXIN) 500 MG tablet    Sig: Take 1 tablet (  500 mg total) by mouth every 8 (eight) hours as needed.    Dispense:  30 tablet    Refill:  0     Procedures: No procedures performed  Clinical Data: No additional findings.  ROS:  All other systems negative, except as noted in the HPI. Review of Systems  Objective: Vital Signs: There were no vitals taken for this visit.  Specialty Comments:  No specialty comments available.  PMFS History: Patient Active Problem List   Diagnosis Date Noted  . Right thyroid nodule 09/05/2014  . 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 Anemia    in late twenties  . Anxiety   . Arthritis    "knees, fingers" (09/05/2014)  . Diabetes mellitus without complication ([place])   . GERD (gastroesophageal reflux disease)   . H/O hiatal hernia   . Hyperlipidemia   . Hypertension   .  Insomnia   . Irritable bowel    with stress  . Obesity   . PONV (postoperative nausea and vomiting)     Family History  Problem Relation Age of Onset  . Cancer Mother        lung  . Heart disease Father   . Heart attack Father   . Diabetes Sister   . Leukemia Brother   . Heart attack Paternal Grandfather   . Breast cancer Neg Hx     Past Surgical History:  Procedure Laterality Date  . ABDOMINAL HYSTERECTOMY  1982   partial  . BREAST BIOPSY Left   . COLONOSCOPY    . FOOT SURGERY Right ~ 2004   "top of my foot"  . SHOULDER ARTHROSCOPY WITH OPEN ROTATOR CUFF REPAIR Right ~ 2000  . THYROID LOBECTOMY Right 09/05/2014  . THYROID LOBECTOMY Right 09/05/2014   Procedure: RIGHT THYROID LOBECTOMY;  Surgeon: Armandina Gemma, MD;  Location: [facility];  Service: General;  Laterality: Right;  . TUBAL LIGATION  1981   Social History   Occupational History  . Not on file  Tobacco Use  . Smoking status: Never Smoker  . Smokeless tobacco: Never Used  Substance and Sexual Activity  . Alcohol use: No  . Drug use: No  . Sexual activity: Not Currently

## 2021-04-04 NOTE — Telephone Encounter (Signed)
Pt updated and lab order placed. Pt currently driving. Will send message to call to schedule follow up appointment.

## 2021-04-04 NOTE — Telephone Encounter (Signed)
Please order lipids and lfts. She cancelled follow up appt, she can get labs and then we can reschedule her follow up to discuss results and next steps.

## 2021-04-15 ENCOUNTER — Telehealth: Payer: Self-pay

## 2021-04-15 NOTE — Telephone Encounter (Signed)
Patient called she is requesting rx for methocarbamol call back:[phone]

## 2021-04-16 MED ORDER — METHOCARBAMOL 500 MG PO TABS: 500.0000 mg | ORAL_TABLET | Freq: Three times a day (TID) | ORAL | 0 refills | Status: DC | PRN

## 2021-04-16 NOTE — Addendum Note (Signed)
Addended by: Dondra Prader R on: 04/16/2021 09:14 AM   Modules accepted: Orders

## 2021-04-17 ENCOUNTER — Other Ambulatory Visit: Payer: Medicare Other

## 2021-04-17 DIAGNOSIS — Z79899 Other long term (current) drug therapy: Secondary | ICD-10-CM | POA: Diagnosis not present

## 2021-04-17 LAB — LIPID PANEL
Chol/HDL Ratio: 3.1 ratio (ref 0.0–4.4)
Cholesterol, Total: 174 mg/dL (ref 100–199)
HDL: 57 mg/dL (ref >39)
LDL Chol Calc (NIH): 86 mg/dL (ref 0–99)
Triglycerides: 182 mg/dL — ABNORMAL HIGH (ref 0–149)
VLDL Cholesterol Cal: 31 mg/dL (ref 5–40)

## 2021-04-17 LAB — HEPATIC FUNCTION PANEL
ALT: 37 IU/L — ABNORMAL HIGH (ref 0–32)
AST: 28 IU/L (ref 0–40)
Albumin: 4.8 g/dL — ABNORMAL HIGH (ref 3.7–4.7)
Alkaline Phosphatase: 79 IU/L (ref 44–121)
Bilirubin Total: 0.5 mg/dL (ref 0.0–1.2)
Bilirubin, Direct: 0.16 mg/dL (ref 0.00–0.40)
Total Protein: 7.1 g/dL (ref 6.0–8.5)

## 2021-04-30 ENCOUNTER — Other Ambulatory Visit: Payer: Self-pay | Admitting: Family

## 2021-05-13 ENCOUNTER — Ambulatory Visit: Payer: Medicare Other | Admitting: Cardiology

## 2021-05-15 ENCOUNTER — Ambulatory Visit (INDEPENDENT_AMBULATORY_CARE_PROVIDER_SITE_OTHER): Payer: Medicare Other | Admitting: Physician Assistant

## 2021-05-15 ENCOUNTER — Encounter: Payer: Self-pay | Admitting: Physician Assistant

## 2021-05-15 ENCOUNTER — Other Ambulatory Visit: Payer: Self-pay

## 2021-05-15 DIAGNOSIS — M25511 Pain in right shoulder: Secondary | ICD-10-CM | POA: Diagnosis not present

## 2021-05-15 MED ORDER — METHYLPREDNISOLONE ACETATE 40 MG/ML IJ SUSP
40.0000 mg | INTRAMUSCULAR | Status: AC | PRN
  Administered 2021-05-15: 40 mg via INTRA_ARTICULAR

## 2021-05-15 MED ORDER — METHOCARBAMOL 500 MG PO TABS: 1.0000 | ORAL_TABLET | Freq: Three times a day (TID) | ORAL | 0 refills | Status: DC | PRN

## 2021-05-15 MED ORDER — LIDOCAINE HCL 1 % IJ SOLN
5.0000 mL | INTRAMUSCULAR | Status: AC | PRN
  Administered 2021-05-15: 5 mL

## 2021-05-15 NOTE — Progress Notes (Signed)
Office Visit Note   Patient: Sheena Trevino           Date of Birth: [birth year]           MRN: [id number] Visit Date: 05/15/2021              Requested by: Kathyrn Lass, [place],  [place] [address] PCP: Kathyrn Lass, MD  No chief complaint on file.     HPI: Patient is a pleasant 75 year old woman who presents for 2 reasons today first.  First she has recurrent right shoulder pain.  She has a history of surgery with Dr. Marlou Sa but continues to have difficulties.  He has given her injections in the past and is requesting an injection today.  Secondly she was seen a few weeks ago for radicular type symptoms radiating down her left leg.  She was unable to tolerate prednisone.  She was given Robaxin which helped but she did not have a steady prescription for it she is wondering if she can have a little bit more as it was helping her quite a bit  Assessment & Plan: Visit Diagnoses: No diagnosis found.  Plan: We will give her 3-week prescription of Robaxin.  If her symptoms return I would recommend an MRI of her lumbar spine.  With regards to her shoulder we will go forward with an injection today.  If she does not get any significant relief from that I have asked her to follow-up with Dr. Marlou Sa  Follow-Up Instructions: No follow-ups on file.   Ortho Exam  Patient is alert, oriented, no adenopathy, well-dressed, normal affect, normal respiratory effort. Examination of her right shoulder she has limited forward extension internal and external rotation secondary to pain.  Distally her CMS is intact.  Most of her pain is focused over the subacromial area.  No paresthesias distally or weakness positive impingement findings her left lower extremity radicular symptoms radiating down her left leg into her foot good dorsiflexion plantarflexion strength is all 5 out of 5 no sensory deficits  Imaging: No results found. No images are attached to the encounter.  Labs: No results  found for: HGBA1C, ESRSEDRATE, CRP, LABURIC, REPTSTATUS, GRAMSTAIN, CULT, LABORGA   Lab Results  Component Value Date   ALBUMIN 4.8 (H) 04/17/2021    No results found for: MG No results found for: VD25OH  No results found for: PREALBUMIN CBC EXTENDED Latest Ref Rng & Units 09/05/2014  WBC 4.0 - 10.5 K/uL 8.8  RBC 3.87 - 5.11 MIL/uL 4.53  HGB 12.0 - 15.0 g/dL 14.1  HCT 36.0 - 46.0 % 40.4  PLT 150 - 400 K/uL 236     There is no height or weight on file to calculate BMI.  Orders:  No orders of the defined types were placed in this encounter.  Meds ordered this encounter  Medications  . methocarbamol (ROBAXIN) 500 MG tablet    Sig: Take 1 tablet (500 mg total) by mouth every 8 (eight) hours as needed.    Dispense:  21 tablet    Refill:  0     Procedures: Large Joint Inj: R subacromial bursa on 05/15/2021 10:55 AM Indications: diagnostic evaluation and pain Details: 22 G 1.5 in needle, posterior approach  Arthrogram: No  Medications: 5 mL lidocaine 1 %; 40 mg methylPREDNISolone acetate 40 MG/ML Outcome: tolerated well, no immediate complications Procedure, treatment alternatives, risks and benefits explained, specific risks discussed. Consent was given by the patient.  Clinical Data: No additional findings.  ROS:  All other systems negative, except as noted in the HPI. Review of Systems  Objective: Vital Signs: There were no vitals taken for this visit.  Specialty Comments:  No specialty comments available.  PMFS History: Patient Active Problem List   Diagnosis Date Noted  . Right thyroid nodule 09/05/2014  . 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 Anemia    in late twenties  . Anxiety   . Arthritis    "knees, fingers" (09/05/2014)  . Diabetes mellitus without complication ([place])   . GERD (gastroesophageal reflux disease)   . H/O hiatal hernia   . Hyperlipidemia   .  Hypertension   . Insomnia   . Irritable bowel    with stress  . Obesity   . PONV (postoperative nausea and vomiting)     Family History  Problem Relation Age of Onset  . Cancer Mother        lung  . Heart disease Father   . Heart attack Father   . Diabetes Sister   . Leukemia Brother   . Heart attack Paternal Grandfather   . Breast cancer Neg Hx     Past Surgical History:  Procedure Laterality Date  . ABDOMINAL HYSTERECTOMY  1982   partial  . BREAST BIOPSY Left   . COLONOSCOPY    . FOOT SURGERY Right ~ 2004   "top of my foot"  . SHOULDER ARTHROSCOPY WITH OPEN ROTATOR CUFF REPAIR Right ~ 2000  . THYROID LOBECTOMY Right 09/05/2014  . THYROID LOBECTOMY Right 09/05/2014   Procedure: RIGHT THYROID LOBECTOMY;  Surgeon: Armandina Gemma, MD;  Location: [facility];  Service: General;  Laterality: Right;  . TUBAL LIGATION  1981   Social History   Occupational History  . Not on file  Tobacco Use  . Smoking status: Never Smoker  . Smokeless tobacco: Never Used  Substance and Sexual Activity  . Alcohol use: No  . Drug use: No  . Sexual activity: Not Currently

## 2021-05-28 DIAGNOSIS — G4700 Insomnia, unspecified: Secondary | ICD-10-CM | POA: Diagnosis not present

## 2021-05-28 DIAGNOSIS — E785 Hyperlipidemia, unspecified: Secondary | ICD-10-CM | POA: Diagnosis not present

## 2021-05-28 DIAGNOSIS — I10 Essential (primary) hypertension: Secondary | ICD-10-CM | POA: Diagnosis not present

## 2021-05-28 DIAGNOSIS — M1990 Unspecified osteoarthritis, unspecified site: Secondary | ICD-10-CM | POA: Diagnosis not present

## 2021-05-28 DIAGNOSIS — E890 Postprocedural hypothyroidism: Secondary | ICD-10-CM | POA: Diagnosis not present

## 2021-05-28 DIAGNOSIS — E1169 Type 2 diabetes mellitus with other specified complication: Secondary | ICD-10-CM | POA: Diagnosis not present

## 2021-06-03 ENCOUNTER — Other Ambulatory Visit: Payer: Self-pay

## 2021-06-03 ENCOUNTER — Encounter: Payer: Self-pay | Admitting: Cardiology

## 2021-06-03 ENCOUNTER — Ambulatory Visit (INDEPENDENT_AMBULATORY_CARE_PROVIDER_SITE_OTHER): Payer: Medicare Other | Admitting: Cardiology

## 2021-06-03 VITALS — BP 146/84 | HR 68 | Ht 63.5 in | Wt 179.0 lb

## 2021-06-03 DIAGNOSIS — I10 Essential (primary) hypertension: Secondary | ICD-10-CM | POA: Diagnosis not present

## 2021-06-03 DIAGNOSIS — I2510 Atherosclerotic heart disease of native coronary artery without angina pectoris: Secondary | ICD-10-CM | POA: Diagnosis not present

## 2021-06-03 DIAGNOSIS — Z7189 Other specified counseling: Secondary | ICD-10-CM

## 2021-06-03 DIAGNOSIS — E7800 Pure hypercholesterolemia, unspecified: Secondary | ICD-10-CM

## 2021-06-03 DIAGNOSIS — E1169 Type 2 diabetes mellitus with other specified complication: Secondary | ICD-10-CM

## 2021-06-03 DIAGNOSIS — E669 Obesity, unspecified: Secondary | ICD-10-CM

## 2021-06-03 DIAGNOSIS — R079 Chest pain, unspecified: Secondary | ICD-10-CM

## 2021-06-03 NOTE — Progress Notes (Signed)
Cardiology Office Note:    Date:  06/03/2021   ID:  Sheena, Trevino 1946/12/16, MRN [id number]  PCP:  Sheena Lass, MD  Cardiologist:  Sheena Dresser, MD  Referring MD: Sheena Lass, MD   CC: follow up  History of Present Illness:    Sheena Trevino is a 75 y.o. female with a hx of hypertension, hyperlipidemia, type II diabetes, anxiety who is seen for follow up today. I initially met her 10/03/20 as a new consult at the request of Sheena Lass, MD for the evaluation and management of chest pain.  CV history: Did not tolerate simvastatin or atorvastatin in the past. Now tolerating low dose rosuvastatin.  Today: Continues to have intermittent chest discomfort. Either she has gotten more used to it or it is less frequent. Still random. No red flag signs, reviewed today.  Tolerating rosuvastatin, though still has muscle aches/joint pains. Reviewed lipids/LFTs from 03/2021. Tolerating aspirin as well.   Checks BP at home, running 130-132/70s. Hasn't taken BP meds yet this morning. Has remotely had issues with hypotension in the afternoon, none recently. This was when she was on more blood pressure medications.  Denies shortness of breath at rest or with normal exertion. No PND, orthopnea, LE edema or unexpected weight gain. No syncope or palpitations.  Past Medical History:  Diagnosis Date  . Anemia    in late twenties  . Anxiety   . Arthritis    "knees, fingers" (09/05/2014)  . Diabetes mellitus without complication ([facility])   . GERD (gastroesophageal reflux disease)   . H/O hiatal hernia   . Hyperlipidemia   . Hypertension   . Insomnia   . Irritable bowel    with stress  . Obesity   . PONV (postoperative nausea and vomiting)     Past Surgical History:  Procedure Laterality Date  . ABDOMINAL HYSTERECTOMY  1982   partial  . BREAST BIOPSY Left   . COLONOSCOPY    . FOOT SURGERY Right ~ 2004   "top of my foot"  . SHOULDER ARTHROSCOPY WITH OPEN ROTATOR CUFF REPAIR Right ~  2000  . THYROID LOBECTOMY Right 09/05/2014  . THYROID LOBECTOMY Right 09/05/2014   Procedure: RIGHT THYROID LOBECTOMY;  Surgeon: Sheena Gemma, MD;  Location: [facility];  Service: General;  Laterality: Right;  . TUBAL LIGATION  1981    Current Medications: Current Outpatient Medications on File Prior to Visit  Medication Sig  . aspirin EC 81 MG tablet Take 81 mg by mouth daily. Swallow whole.  . B Complex Vitamins (VITAMIN B COMPLEX PO) Take by mouth.  . cetirizine (ZYRTEC) 10 MG tablet Take 10 mg by mouth daily.  . hydrochlorothiazide (HYDRODIURIL) 25 MG tablet Take 25 mg by mouth daily.  Marland Kitchen levothyroxine (SYNTHROID) 88 MCG tablet Take 88 mcg by mouth daily.  Marland Kitchen LORazepam (ATIVAN) 0.5 MG tablet Take 0.5 mg by mouth as needed for anxiety. Take 1 tablet at bedtime as needed for sleep or as needed for anxiety.  Marland Kitchen losartan (COZAAR) 100 MG tablet Take 100 mg by mouth daily.  . metFORMIN (GLUCOPHAGE) 500 MG tablet TAKE 1 TABLET BY MOUTH TWICE A DAY WITH A MEAL  . Multiple Vitamins-Minerals (MULTIVITAMIN WITH MINERALS) tablet Take 1 tablet by mouth daily.  Marland Kitchen omeprazole (PRILOSEC OTC) 20 MG tablet Take 20 mg by mouth daily.  . Probiotic Product (PROBIOTIC DAILY PO) Take 1 capsule by mouth daily.   . rosuvastatin (CRESTOR) 5 MG tablet Take 1 tablet (5 mg total) by  mouth daily.  . sertraline (ZOLOFT) 25 MG tablet Take 25 mg by mouth daily.  . vitamin C (ASCORBIC ACID) 500 MG tablet Take 500 mg by mouth daily.  Marland Kitchen zolpidem (AMBIEN CR) 12.5 MG CR tablet Take 12.5 mg by mouth at bedtime as needed for sleep.    No current facility-administered medications on file prior to visit.     Allergies:   Codeine, Lipitor [atorvastatin], Pork-derived products, and Sulfa antibiotics   Social History   Tobacco Use  . Smoking status: Never Smoker  . Smokeless tobacco: Never Used  Substance Use Topics  . Alcohol use: No  . Drug use: No    Family History: family history includes Cancer in her mother; Diabetes in  her sister; Heart attack in her father and paternal grandfather; Heart disease in her father; Leukemia in her brother. There is no history of Breast cancer. father had a history of heart disease. Heavy smoker and heavy drinker, died at around 74 years old of heart issue. Mother was a heavy smoker, didn't have prior heart issues but died instantly of a heart attack at age 48.  ROS:   Please see the history of present illness.  Additional pertinent ROS otherwise unremarkable.    EKGs/Labs/Other Studies Reviewed:    The following studies were reviewed today: CT cardiac 10/18/20 Coronary Arteries:  Normal coronary origin.  Right dominance.  Left main: The left main is a large caliber vessel with a normal take off from the left coronary cusp that trifurcates to form a left anterior descending artery, ramus intermedius, and a left circumflex artery. There is minimal non-calcified plaque (<25%).  Left anterior descending artery: The proximal LAD is diffusely diseased with mixed density plaque (25-49%). The mid LAD contains minimal calcified plaque (<25%). The distal LAD is patent. The LAD gives off 2 patent diagonal branches.  Ramus intermedius: Small vessel with no significant stenosis.  Left circumflex artery: The LCX is non-dominant. The proximal LCX contains minimal non-calcified plaque (<25%). The first OM branch contains minimal non-calcified plaque (<25%). The LCX terminates as a small OM branch that is patent.  Right coronary artery: The RCA is dominant with normal take off from the right coronary cusp. The proximal RCA contains minimal calcified plaque (<25%). The mid RCA contains mild non-calcified plaque (25-49%). The distal RCA contains minimal calcified plaque (<25%). The RCA terminates as a PDA and right posterolateral branch without evidence of plaque or stenosis.   IMPRESSION: 1. Coronary calcium score of 121. This was 68th percentile for age and sex matched  controls.  2. Normal coronary origin with right dominance.  3. Mild CAD in the proximal LAD (25-49%) and mid RCA (25-49%).  RECOMMENDATIONS: 1. CT FFR will be sent due to diffuse mild CAD in the proximal LAD. Consider preventive therapy and risk factor modification.  1. Left Main: 0.98; no significant stenosis.  2. Proximal LAD: 0.93; no significant stenosis. 3. Mid LAD: 0.88; no significant stenosis. 4. LCX: 0.94; no significant stenosis. 5. Proximal RCA: 0.95; no significant stenosis. 6. Mid RCA: 0.83; no significant stenosis.  IMPRESSION: 1.  CT FFR analysis didn't show any significant stenosis  EKG:  EKG is personally reviewed.  The ekg ordered 10/03/20 demonstrates NSR at 71 bpm with nonspecific NT pattern  Recent Labs: 10/03/2020: BUN 16; Creatinine, Ser 0.78; Potassium 4.0; Sodium 139 04/17/2021: ALT 37  Recent Lipid Panel    Component Value Date/Time   CHOL 174 04/17/2021 0935   TRIG 182 (H) 04/17/2021 0935  HDL 57 04/17/2021 0935   CHOLHDL 3.1 04/17/2021 0935   LDLCALC 86 04/17/2021 0935    Physical Exam:    VS:  BP (!) 146/84   Pulse 68   Ht 5' 3.5" (1.613 m)   Wt 179 lb (81.2 kg)   SpO2 97%   BMI 31.21 kg/m     Wt Readings from Last 3 Encounters:  06/03/21 179 lb (81.2 kg)  11/16/20 183 lb (83 kg)  11/14/20 178 lb (80.7 kg)    GEN: Well nourished, well developed in no acute distress HEENT: Normal, moist mucous membranes NECK: No JVD CARDIAC: regular rhythm, normal S1 and S2, no rubs or gallops. No murmur. VASCULAR: Radial and DP pulses 2+ bilaterally. No carotid bruits RESPIRATORY:  Clear to auscultation without rales, wheezing or rhonchi  ABDOMEN: Soft, non-tender, non-distended MUSCULOSKELETAL:  Ambulates independently SKIN: Warm and dry, no edema NEUROLOGIC:  Alert and oriented x 3. No focal neuro deficits noted. PSYCHIATRIC:  Normal affect   ASSESSMENT:    1. Nonobstructive atherosclerosis of coronary artery   2. Chest pain of  uncertain etiology   3. Essential hypertension   4. Pure hypercholesterolemia   5. Diabetes mellitus type 2 in obese (HCC)   6. Cardiac risk counseling   7. Counseling on health promotion and disease prevention    PLAN:    Chest pain -CT cardiac and FFR shows mild CAD, but no obstructive lesions -suggests pain is not ischemic in origin -counseled on red flag warning signs that need immediate medical attention  Nonobstructive CAD: -continue aspirin 81 mg daily -tolerating rosuvastatin, gradually ramped to 5 mg daily. Discussed goal LDL <70, but as she has had intolerance to other statins in the past and has mild aches on this dose, will keep at 5 mg daily dose for now.  Hypertension: -blood pressure elevated today, goal of <130/80 -reports typically well controlled, has not taken AM meds yet today -if home numbers elevated, she will contact me -continue HCTZ, losartan  Hyperlipidemia: Lipids 08/31/20(pre statin): Tchol 199, HDL 46, LDL 121, TG 181 Lipids 04/17/21 (5 mg rosuvastatin daily): Tchol 174, HDL 57, LDL 86, TG 182 -continue rosuvastatin 5 mg daily, see discussion above  Type II diabetes: -last A1c per [facility] 6.3 -BMI 31 -continue aspirin -statin as above -on metformin. Consider SGLT2i or GLP1RA with nonobstructive CAD if additional medication needed in the future.  Cardiac risk counseling and prevention recommendations: -recommend heart healthy/Mediterranean diet, with whole grains, fruits, vegetable, fish, lean meats, nuts, and olive oil. Limit salt. -recommend moderate walking, 3-5 times/week for 30-50 minutes each session. Aim for at least 150 minutes.week. Goal should be pace of 3 miles/hours, or walking 1.5 miles in 30 minutes -recommend avoidance of tobacco products. Avoid excess alcohol.  Plan for follow up: 9 mos, to stagger with PCP, then annually  Sheena Dresser, MD, PhD [place]  [facility]    Medication Adjustments/Labs and Tests  Ordered: Current medicines are reviewed at length with the patient today.  Concerns regarding medicines are outlined above.  No orders of the defined types were placed in this encounter.  No orders of the defined types were placed in this encounter.   Patient Instructions  Medication Instructions:  Your Physician recommend you continue on your current medication as directed.    *If you need a refill on your cardiac medications before your next appointment, please call your pharmacy*   Lab Work: None ordered today   Testing/Procedures: None ordered today   Follow-Up:  At [facility], you and your health needs are our priority.  As part of our continuing mission to provide you with exceptional heart care, we have created designated Provider Care Teams.  These Care Teams include your primary Cardiologist (physician) and Advanced Practice Providers (APPs -  Physician Assistants and Nurse Practitioners) who all work together to provide you with the care you need, when you need it.  We recommend signing up for the patient portal called "MyChart".  Sign up information is provided on this After Visit Summary.  MyChart is used to connect with patients for Virtual Visits (Telemedicine).  Patients are able to view lab/test results, encounter notes, upcoming appointments, etc.  Non-urgent messages can be sent to your provider as well.   To learn more about what you can do with MyChart, go to [email or web address].    Your next appointment:   9 month(s) @ [address] [place], [place] [address]   The format for your next appointment:   In Person  Provider:   Buford Dresser, MD       Signed, Sheena Dresser, MD PhD 06/03/2021   [place]

## 2021-06-03 NOTE — Patient Instructions (Signed)
Medication Instructions:  Your Physician recommend you continue on your current medication as directed.    *If you need a refill on your cardiac medications before your next appointment, please call your pharmacy*   Lab Work: None ordered today   Testing/Procedures: None ordered today   Follow-Up: At [facility] you and your health needs are our priority.  As part of our continuing mission to provide you with exceptional heart care, we have created designated Provider Care Teams.  These Care Teams include your primary Cardiologist (physician) and Advanced Practice Providers (APPs -  Physician Assistants and Nurse Practitioners) who all work together to provide you with the care you need, when you need it.  We recommend signing up for the patient portal called "MyChart".  Sign up information is provided on this After Visit Summary.  MyChart is used to connect with patients for Virtual Visits (Telemedicine).  Patients are able to view lab/test results, encounter notes, upcoming appointments, etc.  Non-urgent messages can be sent to your provider as well.   To learn more about what you can do with MyChart, go to [email or web address].    Your next appointment:   9 month(s) @ [address] [place], [place] [address]   The format for your next appointment:   In Person  Provider:   Buford Dresser, MD

## 2021-06-11 ENCOUNTER — Other Ambulatory Visit: Payer: Self-pay | Admitting: Family Medicine

## 2021-06-11 DIAGNOSIS — Z1231 Encounter for screening mammogram for malignant neoplasm of breast: Secondary | ICD-10-CM

## 2021-06-19 DIAGNOSIS — E119 Type 2 diabetes mellitus without complications: Secondary | ICD-10-CM | POA: Diagnosis not present

## 2021-06-25 ENCOUNTER — Ambulatory Visit
Admission: RE | Admit: 2021-06-25 | Discharge: 2021-06-25 | Disposition: A | Discharge status: Home / Self Care | Payer: Medicare Other | Source: Ambulatory Visit | Attending: Family Medicine | Admitting: Family Medicine

## 2021-06-25 ENCOUNTER — Other Ambulatory Visit: Payer: Self-pay

## 2021-06-25 DIAGNOSIS — Z1231 Encounter for screening mammogram for malignant neoplasm of breast: Secondary | ICD-10-CM

## 2021-07-09 DIAGNOSIS — L821 Other seborrheic keratosis: Secondary | ICD-10-CM | POA: Diagnosis not present

## 2021-07-09 DIAGNOSIS — D225 Melanocytic nevi of trunk: Secondary | ICD-10-CM | POA: Diagnosis not present

## 2021-07-09 DIAGNOSIS — L814 Other melanin hyperpigmentation: Secondary | ICD-10-CM | POA: Diagnosis not present

## 2021-07-09 DIAGNOSIS — L720 Epidermal cyst: Secondary | ICD-10-CM | POA: Diagnosis not present

## 2021-07-09 DIAGNOSIS — D1801 Hemangioma of skin and subcutaneous tissue: Secondary | ICD-10-CM | POA: Diagnosis not present

## 2021-08-12 ENCOUNTER — Ambulatory Visit: Payer: Medicare Other

## 2021-09-17 DIAGNOSIS — Z1389 Encounter for screening for other disorder: Secondary | ICD-10-CM | POA: Diagnosis not present

## 2021-09-17 DIAGNOSIS — Z0000 Encounter for general adult medical examination without abnormal findings: Secondary | ICD-10-CM | POA: Diagnosis not present

## 2021-09-17 DIAGNOSIS — E1169 Type 2 diabetes mellitus with other specified complication: Secondary | ICD-10-CM | POA: Diagnosis not present

## 2021-09-17 DIAGNOSIS — I10 Essential (primary) hypertension: Secondary | ICD-10-CM | POA: Diagnosis not present

## 2021-09-17 DIAGNOSIS — E785 Hyperlipidemia, unspecified: Secondary | ICD-10-CM | POA: Diagnosis not present

## 2021-09-17 DIAGNOSIS — E890 Postprocedural hypothyroidism: Secondary | ICD-10-CM | POA: Diagnosis not present

## 2021-09-17 DIAGNOSIS — Z79899 Other long term (current) drug therapy: Secondary | ICD-10-CM | POA: Diagnosis not present

## 2021-09-20 DIAGNOSIS — F411 Generalized anxiety disorder: Secondary | ICD-10-CM | POA: Diagnosis not present

## 2021-09-20 DIAGNOSIS — G4700 Insomnia, unspecified: Secondary | ICD-10-CM | POA: Diagnosis not present

## 2021-09-20 DIAGNOSIS — I10 Essential (primary) hypertension: Secondary | ICD-10-CM | POA: Diagnosis not present

## 2021-09-20 DIAGNOSIS — E890 Postprocedural hypothyroidism: Secondary | ICD-10-CM | POA: Diagnosis not present

## 2021-09-20 DIAGNOSIS — R7303 Prediabetes: Secondary | ICD-10-CM | POA: Diagnosis not present

## 2021-09-20 DIAGNOSIS — R030 Elevated blood-pressure reading, without diagnosis of hypertension: Secondary | ICD-10-CM | POA: Diagnosis not present

## 2021-09-20 DIAGNOSIS — E669 Obesity, unspecified: Secondary | ICD-10-CM | POA: Diagnosis not present

## 2021-10-23 ENCOUNTER — Encounter: Payer: Self-pay | Admitting: Orthopedic Surgery

## 2021-10-23 ENCOUNTER — Other Ambulatory Visit: Payer: Self-pay

## 2021-10-23 ENCOUNTER — Ambulatory Visit (INDEPENDENT_AMBULATORY_CARE_PROVIDER_SITE_OTHER): Payer: Medicare Other | Admitting: Surgical

## 2021-10-23 DIAGNOSIS — M25511 Pain in right shoulder: Secondary | ICD-10-CM

## 2021-10-23 DIAGNOSIS — M12811 Other specific arthropathies, not elsewhere classified, right shoulder: Secondary | ICD-10-CM

## 2021-10-23 MED ORDER — METHYLPREDNISOLONE ACETATE 40 MG/ML IJ SUSP
40.0000 mg | INTRAMUSCULAR | Status: AC | PRN
  Administered 2021-10-23: 40 mg via INTRA_ARTICULAR

## 2021-10-23 MED ORDER — LIDOCAINE HCL 1 % IJ SOLN
5.0000 mL | INTRAMUSCULAR | Status: AC | PRN
  Administered 2021-10-23: 5 mL

## 2021-10-23 MED ORDER — BUPIVACAINE HCL 0.5 % IJ SOLN
9.0000 mL | INTRAMUSCULAR | Status: AC | PRN
  Administered 2021-10-23: 9 mL via INTRA_ARTICULAR

## 2021-10-23 NOTE — Progress Notes (Signed)
Office Visit Note   Patient: Sheena Trevino           Date of Birth: 04-01-1946           MRN: [id number] Visit Date: 10/23/2021 Requested by: Kathyrn Lass, [place],  [place] [address] PCP: Kathyrn Lass, MD  Subjective: Chief Complaint  Patient presents with   Right Shoulder - Pain    HPI: Sheena Trevino is a 75 y.o. female who presents to the office complaining of right shoulder pain.  Patient has history of rotator cuff arthropathy of the right shoulder.  She has had previous injections that provided several months of relief at a time.  Last injection was in May 2022 and lasted only a couple days.  She complains of continued anterior and posterior pain in the shoulder with radiation down to the elbow.  No radiation past the elbow or numbness or tingling.  No neck pain.  No new injuries.  She is able to function okay with her shoulder aside from the pain.  Pain does keep her up at night when she tries to sleep on the right side.              ROS: All systems reviewed are negative as they relate to the chief complaint within the history of present illness.  Patient denies fevers or chills.  Assessment & Plan: Visit Diagnoses:  1. Rotator cuff arthropathy of right shoulder     Plan: Patient is a 75 year old female who presents for repeat evaluation of the right shoulder pain.  Has history of rotator cuff arthropathy.  She does not want to pursue any surgical intervention.  Previous injections have given her great relief.  She would like to try repeat injection today.  Tolerated the injection well.  Follow-up with the office as needed.  Follow-Up Instructions: No follow-ups on file.   Orders:  No orders of the defined types were placed in this encounter.  No orders of the defined types were placed in this encounter.     Procedures: Large Joint Inj: R glenohumeral on 10/23/2021 9:11 PM Indications: diagnostic evaluation and pain Details: 18 G 1.5 in needle,  posterior approach  Arthrogram: No  Medications: 9 mL bupivacaine 0.5 %; 40 mg methylPREDNISolone acetate 40 MG/ML; 5 mL lidocaine 1 % Outcome: tolerated well, no immediate complications Procedure, treatment alternatives, risks and benefits explained, specific risks discussed. Consent was given by the patient. Immediately prior to procedure a time out was called to verify the correct patient, procedure, equipment, support staff and site/side marked as required. Patient was prepped and draped in the usual sterile fashion.      Clinical Data: No additional findings.  Objective: Vital Signs: There were no vitals taken for this visit.  Physical Exam:  Constitutional: Patient appears well-developed HEENT:  Head: Normocephalic Eyes:EOM are normal Neck: Normal range of motion Cardiovascular: Normal rate Pulmonary/chest: Effort normal Neurologic: Patient is alert Skin: Skin is warm Psychiatric: Patient has normal mood and affect  Ortho Exam: Ortho exam demonstrates right shoulder with 45 degrees external rotation, 95 degrees abduction, 150 degrees forward flexion.  Axillary nerve intact with deltoid firing.  Weakness with infraspinatus.  5/5 motor strength of supra and subscapularis.  5/5 motor strength of bilateral grip strength, finger abduction, pronation/supination, bicep, deltoid.  Specialty Comments:  No specialty comments available.  Imaging: No results found.   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   Past Medical History:  Diagnosis Date   Anemia    in late twenties   Anxiety    Arthritis    "knees, fingers" (09/05/2014)   Diabetes mellitus without complication (HCC)    GERD (gastroesophageal reflux disease)    H/O hiatal hernia    Hyperlipidemia    Hypertension    Insomnia    Irritable bowel    with stress   Obesity    PONV  (postoperative nausea and vomiting)     Family History  Problem Relation Age of Onset   Cancer Mother        lung   Heart disease Father    Heart attack Father    Diabetes Sister    Leukemia Brother    Heart attack Paternal Grandfather    Breast cancer Neg Hx     Past Surgical History:  Procedure Laterality Date   ABDOMINAL HYSTERECTOMY  1982   partial   BREAST BIOPSY Left    COLONOSCOPY     FOOT SURGERY Right ~ 2004   "top of my foot"   SHOULDER ARTHROSCOPY WITH OPEN ROTATOR CUFF REPAIR Right ~ 2000   THYROID LOBECTOMY Right 09/05/2014   THYROID LOBECTOMY Right 09/05/2014   Procedure: RIGHT THYROID LOBECTOMY;  Surgeon: Armandina Gemma, MD;  Location: [facility];  Service: General;  Laterality: Right;   [place]   Social History   Occupational History   Not on file  Tobacco Use   Smoking status: Never   Smokeless tobacco: Never  Substance and Sexual Activity   Alcohol use: No   Drug use: No   Sexual activity: Not Currently

## 2021-11-19 ENCOUNTER — Other Ambulatory Visit: Payer: Self-pay | Admitting: Surgical

## 2021-11-19 ENCOUNTER — Telehealth: Payer: Self-pay | Admitting: Orthopedic Surgery

## 2021-11-19 MED ORDER — METHOCARBAMOL 500 MG PO TABS: 500.0000 mg | ORAL_TABLET | Freq: Three times a day (TID) | ORAL | 0 refills | Status: DC | PRN

## 2021-11-19 NOTE — Telephone Encounter (Signed)
Notified patient. Verbalized understanding.

## 2021-11-19 NOTE — Telephone Encounter (Signed)
Sent in RX for Robaxin, recommend appointment for eval if she has persistent pain despite this med

## 2021-11-19 NOTE — Telephone Encounter (Signed)
Patient called advised she pulled a muscle in her right hip 3.5 weeks ago and the pain is not going away. Patient said she pulled a muscle back in the summer. Patient asked if she can get a muscle relaxer called into her pharmacy.  The number to contact patient [phone]

## 2021-11-29 DIAGNOSIS — E063 Autoimmune thyroiditis: Secondary | ICD-10-CM | POA: Diagnosis not present

## 2021-11-29 DIAGNOSIS — Z86010 Personal history of colonic polyps: Secondary | ICD-10-CM | POA: Diagnosis not present

## 2021-11-29 DIAGNOSIS — Z1211 Encounter for screening for malignant neoplasm of colon: Secondary | ICD-10-CM | POA: Diagnosis not present

## 2021-11-29 DIAGNOSIS — K219 Gastro-esophageal reflux disease without esophagitis: Secondary | ICD-10-CM | POA: Diagnosis not present

## 2021-11-29 DIAGNOSIS — I10 Essential (primary) hypertension: Secondary | ICD-10-CM | POA: Diagnosis not present

## 2021-11-29 DIAGNOSIS — E119 Type 2 diabetes mellitus without complications: Secondary | ICD-10-CM | POA: Diagnosis not present

## 2021-11-29 DIAGNOSIS — K6389 Other specified diseases of intestine: Secondary | ICD-10-CM | POA: Diagnosis not present

## 2021-12-04 ENCOUNTER — Other Ambulatory Visit: Payer: Self-pay

## 2021-12-04 ENCOUNTER — Ambulatory Visit (INDEPENDENT_AMBULATORY_CARE_PROVIDER_SITE_OTHER): Payer: Medicare Other | Admitting: Orthopedic Surgery

## 2021-12-04 DIAGNOSIS — E890 Postprocedural hypothyroidism: Secondary | ICD-10-CM | POA: Diagnosis not present

## 2021-12-04 DIAGNOSIS — M545 Low back pain, unspecified: Secondary | ICD-10-CM

## 2021-12-04 DIAGNOSIS — M791 Myalgia, unspecified site: Secondary | ICD-10-CM | POA: Diagnosis not present

## 2021-12-04 DIAGNOSIS — M2550 Pain in unspecified joint: Secondary | ICD-10-CM | POA: Diagnosis not present

## 2021-12-04 MED ORDER — PREDNISONE 5 MG (21) PO TBPK: ORAL_TABLET | ORAL | 0 refills | Status: DC

## 2021-12-07 ENCOUNTER — Encounter: Payer: Self-pay | Admitting: Orthopedic Surgery

## 2021-12-07 NOTE — Progress Notes (Signed)
Office Visit Note   Patient: Sheena Trevino           Date of Birth: Jan 09, 1946           MRN: [id number] Visit Date: 12/04/2021 Requested by: Sheena Trevino, [place],  [place] [address] PCP: Sheena Lass, MD  Subjective: Chief Complaint  Patient presents with   Other    Mult joint pain     HPI: [name] is a 75 year old patient with muscle spasm and pain "everywhere".  Started when she had COVID about 2 months ago.  Does report some back pain radiating into both legs.  She also describes bilateral shoulder pain.  She has tried muscle relaxers which has made her feel worse.  Advil helps for about an hour.  She did get an adjustable bed.  Major pain is in the right buttock region with very little groin pain.  Denies any numbness and tingling.              ROS: All systems reviewed are negative as they relate to the chief complaint within the history of present illness.  Patient denies  fevers or chills.   Assessment & Plan: Visit Diagnoses:  1. Acute left-sided low back pain, unspecified whether sciatica present     Plan: Impression is possible arthralgias following COVID.  Alternatively this could be related to her lumbar spine based on the majority of her symptoms.  She does have some mild arthritis on plain radiographs done earlier.  Recommend MRI L-spine to evaluate bilateral radiculopathy as well as Medrol Dosepak for 6 days.  Follow-up after that study.  Follow-Up Instructions: Return for after MRI.   Orders:  Orders Placed This Encounter  Procedures   MR Lumbar Spine w/o contrast   Meds ordered this encounter  Medications   predniSONE (STERAPRED UNI-PAK 21 TAB) 5 MG (21) TBPK tablet    Sig: Take dose pak as directed    Dispense:  21 tablet    Refill:  0      Procedures: No procedures performed   Clinical Data: No additional findings.  Objective: Vital Signs: There were no vitals taken for this visit.  Physical Exam:   Constitutional:  Patient appears well-developed HEENT:  Head: Normocephalic Eyes:EOM are normal Neck: Normal range of motion Cardiovascular: Normal rate Pulmonary/chest: Effort normal Neurologic: Patient is alert Skin: Skin is warm Psychiatric: Patient has normal mood and affect   Ortho Exam: Ortho exam demonstrates full active and passive range of motion of the hips.  No nerve root tension signs.  No paresthesias L1 S1 bilaterally.  Reflex symmetric 0 1+ out of 4 bilateral patella and Achilles.  Pedal pulses palpable.  Gait is normal but she does have a little bit of hip flexor weakness in terms of getting up.  5 out of 5 ankle dorsiflexion plantarflexion quad hamstring strength.  Hip flexor weakness is a little bit weaker than her other lower extremity muscles.  No masses lymphadenopathy or skin changes noted in that back region.  Bilateral upper extremities have pretty reasonable range of motion and strength with wrist flexors extensors biceps triceps and deltoid.  Specialty Comments:  No specialty comments available.  Imaging: No results found.   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   Past Medical History:  Diagnosis Date   Anemia    in late twenties  Anxiety    Arthritis    "knees, fingers" (09/05/2014)   Diabetes mellitus without complication (HCC)    GERD (gastroesophageal reflux disease)    H/O hiatal hernia    Hyperlipidemia    Hypertension    Insomnia    Irritable bowel    with stress   Obesity    PONV (postoperative nausea and vomiting)     Family History  Problem Relation Age of Onset   Cancer Mother        lung   Heart disease Father    Heart attack Father    Diabetes Sister    Leukemia Brother    Heart attack Paternal Grandfather    Breast cancer Neg Hx     Past Surgical History:  Procedure Laterality Date   ABDOMINAL HYSTERECTOMY   1982   partial   BREAST BIOPSY Left    COLONOSCOPY     FOOT SURGERY Right ~ 2004   "top of my foot"   SHOULDER ARTHROSCOPY WITH OPEN ROTATOR CUFF REPAIR Right ~ 2000   THYROID LOBECTOMY Right 09/05/2014   THYROID LOBECTOMY Right 09/05/2014   Procedure: RIGHT THYROID LOBECTOMY;  Surgeon: Armandina Gemma, MD;  Location: [facility];  Service: General;  Laterality: Right;   [place]   Social History   Occupational History   Not on file  Tobacco Use   Smoking status: Never   Smokeless tobacco: Never  Substance and Sexual Activity   Alcohol use: No   Drug use: No   Sexual activity: Not Currently

## 2022-01-20 DIAGNOSIS — I10 Essential (primary) hypertension: Secondary | ICD-10-CM | POA: Diagnosis not present

## 2022-01-20 DIAGNOSIS — M353 Polymyalgia rheumatica: Secondary | ICD-10-CM | POA: Diagnosis not present

## 2022-01-21 DIAGNOSIS — M25512 Pain in left shoulder: Secondary | ICD-10-CM | POA: Diagnosis not present

## 2022-01-21 DIAGNOSIS — M25511 Pain in right shoulder: Secondary | ICD-10-CM | POA: Diagnosis not present

## 2022-01-21 DIAGNOSIS — M353 Polymyalgia rheumatica: Secondary | ICD-10-CM | POA: Diagnosis not present

## 2022-01-21 DIAGNOSIS — E669 Obesity, unspecified: Secondary | ICD-10-CM | POA: Diagnosis not present

## 2022-01-21 DIAGNOSIS — Z6831 Body mass index (BMI) 31.0-31.9, adult: Secondary | ICD-10-CM | POA: Diagnosis not present

## 2022-02-25 DIAGNOSIS — E669 Obesity, unspecified: Secondary | ICD-10-CM | POA: Diagnosis not present

## 2022-02-25 DIAGNOSIS — M25512 Pain in left shoulder: Secondary | ICD-10-CM | POA: Diagnosis not present

## 2022-02-25 DIAGNOSIS — M25511 Pain in right shoulder: Secondary | ICD-10-CM | POA: Diagnosis not present

## 2022-02-25 DIAGNOSIS — R051 Acute cough: Secondary | ICD-10-CM | POA: Diagnosis not present

## 2022-02-25 DIAGNOSIS — R300 Dysuria: Secondary | ICD-10-CM | POA: Diagnosis not present

## 2022-02-25 DIAGNOSIS — M353 Polymyalgia rheumatica: Secondary | ICD-10-CM | POA: Diagnosis not present

## 2022-02-25 DIAGNOSIS — Z6831 Body mass index (BMI) 31.0-31.9, adult: Secondary | ICD-10-CM | POA: Diagnosis not present

## 2022-03-25 DIAGNOSIS — I10 Essential (primary) hypertension: Secondary | ICD-10-CM | POA: Diagnosis not present

## 2022-03-25 DIAGNOSIS — I700 Atherosclerosis of aorta: Secondary | ICD-10-CM | POA: Diagnosis not present

## 2022-03-25 DIAGNOSIS — R7303 Prediabetes: Secondary | ICD-10-CM | POA: Diagnosis not present

## 2022-03-25 DIAGNOSIS — I2510 Atherosclerotic heart disease of native coronary artery without angina pectoris: Secondary | ICD-10-CM | POA: Diagnosis not present

## 2022-03-25 DIAGNOSIS — E559 Vitamin D deficiency, unspecified: Secondary | ICD-10-CM | POA: Diagnosis not present

## 2022-03-25 DIAGNOSIS — M353 Polymyalgia rheumatica: Secondary | ICD-10-CM | POA: Diagnosis not present

## 2022-03-25 DIAGNOSIS — R5383 Other fatigue: Secondary | ICD-10-CM | POA: Diagnosis not present

## 2022-03-25 DIAGNOSIS — E785 Hyperlipidemia, unspecified: Secondary | ICD-10-CM | POA: Diagnosis not present

## 2022-03-25 DIAGNOSIS — K760 Fatty (change of) liver, not elsewhere classified: Secondary | ICD-10-CM | POA: Diagnosis not present

## 2022-03-25 DIAGNOSIS — Z7984 Long term (current) use of oral hypoglycemic drugs: Secondary | ICD-10-CM | POA: Diagnosis not present

## 2022-03-25 DIAGNOSIS — E669 Obesity, unspecified: Secondary | ICD-10-CM | POA: Diagnosis not present

## 2022-03-25 DIAGNOSIS — Z6831 Body mass index (BMI) 31.0-31.9, adult: Secondary | ICD-10-CM | POA: Diagnosis not present

## 2022-05-01 ENCOUNTER — Encounter (HOSPITAL_BASED_OUTPATIENT_CLINIC_OR_DEPARTMENT_OTHER): Payer: Self-pay | Admitting: Cardiology

## 2022-05-01 ENCOUNTER — Ambulatory Visit (INDEPENDENT_AMBULATORY_CARE_PROVIDER_SITE_OTHER): Payer: Medicare Other | Admitting: Cardiology

## 2022-05-01 VITALS — BP 112/72 | HR 83 | Ht 63.5 in | Wt 182.9 lb

## 2022-05-01 DIAGNOSIS — Z6831 Body mass index (BMI) 31.0-31.9, adult: Secondary | ICD-10-CM | POA: Diagnosis not present

## 2022-05-01 DIAGNOSIS — E7800 Pure hypercholesterolemia, unspecified: Secondary | ICD-10-CM

## 2022-05-01 DIAGNOSIS — Z7189 Other specified counseling: Secondary | ICD-10-CM | POA: Diagnosis not present

## 2022-05-01 DIAGNOSIS — E669 Obesity, unspecified: Secondary | ICD-10-CM

## 2022-05-01 DIAGNOSIS — M353 Polymyalgia rheumatica: Secondary | ICD-10-CM | POA: Diagnosis not present

## 2022-05-01 DIAGNOSIS — I2510 Atherosclerotic heart disease of native coronary artery without angina pectoris: Secondary | ICD-10-CM

## 2022-05-01 DIAGNOSIS — I10 Essential (primary) hypertension: Secondary | ICD-10-CM

## 2022-05-01 DIAGNOSIS — I493 Ventricular premature depolarization: Secondary | ICD-10-CM

## 2022-05-01 DIAGNOSIS — M25511 Pain in right shoulder: Secondary | ICD-10-CM | POA: Diagnosis not present

## 2022-05-01 DIAGNOSIS — E1169 Type 2 diabetes mellitus with other specified complication: Secondary | ICD-10-CM

## 2022-05-01 DIAGNOSIS — M25512 Pain in left shoulder: Secondary | ICD-10-CM | POA: Diagnosis not present

## 2022-05-01 NOTE — Progress Notes (Signed)
?Cardiology Office Note:   ? ?Date:  05/01/2022  ? ?ID:  Sheena Trevino, DOB 10-05-1946, MRN [id number] ? ?PCP:  Kathyrn Lass, MD  ?Cardiologist:  Buford Dresser, MD ? ?Referring MD: Kathyrn Lass, MD  ? ?CC: follow up ? ?History of Present Illness:   ? ?Sheena Trevino is a 75 y.o. female with a hx of hypertension, hyperlipidemia, type II diabetes, anxiety who is seen for follow up today. I initially met her 10/03/20 as a new consult at the request of Kathyrn Lass, MD for the evaluation and management of chest pain. ? ?CV history: Did not tolerate simvastatin or atorvastatin in the past. Now tolerating low dose rosuvastatin. ? ?At her last appointment she continued to have intermittent chest discomfort that seemed to be less frequent but still random. Her BP at home was running 130-132/70s. Has remotely had issues with hypotension in the afternoon, none at that visit. This was when she was on more blood pressure medications. She was tolerating rosuvastatin, though still had muscle aches/joint pains. Reviewed lipids/LFTs from 03/2021. Was tolerating aspirin as well.  ? ?Today: ?Since her last visit she was diagnosed with PMR. Her pain has returned, but is less severe. It was thought her prednisone was tapered too quickly. ? ?She still has episodes of chest pressure, fairly short duration. When this occurs it is hard to take a deep breath for a time. She is closer to being certain that stress is her cause of the chest pressure. ? ?She continues to notice random palpitations at rest and with activity, typically lasting for seconds at a time. They are sporadic, possibly occurring  2-3 times a day, or once every 4 days. Noted today during her EKG and during this visit per the patient. ? ?She uses an inhaler on occasion for times when she is only able to "take half a breath" which she believes is related to seasonal allergies. ? ?She endorses occasional LE edema. ? ?She denies any lightheadedness, headaches, syncope,  orthopnea, or PND. ? ? ?Past Medical History:  ?Diagnosis Date  ? Anemia   ? in late twenties  ? Anxiety   ? Arthritis   ? "knees, fingers" (09/05/2014)  ? Diabetes mellitus without complication ([place] [facility])   ? GERD (gastroesophageal reflux disease)   ? H/O hiatal hernia   ? Hyperlipidemia   ? Hypertension   ? Insomnia   ? Irritable bowel   ? with stress  ? Obesity   ? PONV (postoperative nausea and vomiting)   ? ? ?Past Surgical History:  ?Procedure Laterality Date  ? ABDOMINAL HYSTERECTOMY  1982  ? partial  ? BREAST BIOPSY Left   ? COLONOSCOPY    ? FOOT SURGERY Right ~ 2004  ? "top of my foot"  ? SHOULDER ARTHROSCOPY WITH OPEN ROTATOR CUFF REPAIR Right ~ 2000  ? THYROID LOBECTOMY Right 09/05/2014  ? THYROID LOBECTOMY Right 09/05/2014  ? Procedure: RIGHT THYROID LOBECTOMY;  Surgeon: Armandina Gemma, MD;  Location: [facility];  Service: General;  Laterality: Right;  ? [place]  ? ? ?Current Medications: ?Current Outpatient Medications on File Prior to Visit  ?Medication Sig  ? albuterol (VENTOLIN HFA) 108 (90 Base) MCG/ACT inhaler Inhale 1 puff into the lungs as needed.  ? amLODipine (NORVASC) 5 MG tablet Take 5 mg by mouth daily.  ? aspirin EC 81 MG tablet Take 81 mg by mouth daily. Swallow whole.  ? B Complex Vitamins (VITAMIN B COMPLEX PO) Take by mouth.  ?  cetirizine (ZYRTEC) 10 MG tablet Take 10 mg by mouth daily.  ? hydrochlorothiazide (HYDRODIURIL) 25 MG tablet Take 25 mg by mouth daily.  ? levothyroxine (SYNTHROID) 88 MCG tablet Take 88 mcg by mouth daily.  ? LORazepam (ATIVAN) 0.5 MG tablet Take 0.5 mg by mouth as needed for anxiety. Take 1 tablet at bedtime as needed for sleep or as needed for anxiety.  ? losartan (COZAAR) 100 MG tablet Take 100 mg by mouth daily.  ? metFORMIN (GLUCOPHAGE) 500 MG tablet TAKE 1 TABLET BY MOUTH TWICE A DAY WITH A MEAL  ? methocarbamol (ROBAXIN) 500 MG tablet Take 1 tablet (500 mg total) by mouth every 8 (eight) hours as needed.  ? Multiple Vitamins-Minerals (MULTIVITAMIN WITH  MINERALS) tablet Take 1 tablet by mouth daily.  ? omeprazole (PRILOSEC OTC) 20 MG tablet Take 20 mg by mouth daily.  ? predniSONE (DELTASONE) 1 MG tablet Take 3 tablets by mouth daily at 12 noon. Together with 5 mg to equal 8 mg  ? predniSONE (DELTASONE) 5 MG tablet Take 5 mg by mouth daily.  ? Probiotic Product (PROBIOTIC DAILY PO) Take 1 capsule by mouth daily.   ? rosuvastatin (CRESTOR) 5 MG tablet Take 1 tablet (5 mg total) by mouth daily.  ? vitamin C (ASCORBIC ACID) 500 MG tablet Take 500 mg by mouth daily.  ? zolpidem (AMBIEN CR) 12.5 MG CR tablet Take 12.5 mg by mouth at bedtime as needed for sleep.   ? ?No current facility-administered medications on file prior to visit.  ?  ? ?Allergies:   Codeine, Lipitor [atorvastatin], Pork-derived products, and Sulfa antibiotics  ? ?Social History  ? ?Tobacco Use  ? Smoking status: Never  ? Smokeless tobacco: Never  ?Substance Use Topics  ? Alcohol use: No  ? Drug use: No  ? ? ?Family History: ?family history includes Cancer in her mother; Diabetes in her sister; Heart attack in her father and paternal grandfather; Heart disease in her father; Leukemia in her brother. There is no history of Breast cancer. father had a history of heart disease. Heavy smoker and heavy drinker, died at around 75 years old of heart issue. Mother was a heavy smoker, didn't have prior heart issues but died instantly of a heart attack at age 53. ? ?ROS:   ?Please see the history of present illness.   ?(+) Palpitations ?(+) Myalgias ?(+) Joint pain ?(+) Chest pressure ?(+) Stress ?Additional pertinent ROS otherwise unremarkable.  ? ? ?EKGs/Labs/Other Studies Reviewed:   ? ?The following studies were reviewed today: ? ?CT cardiac 10/18/20 ?Coronary Arteries:  Normal coronary origin.  Right dominance. ?  ?Left main: The left main is a large caliber vessel with a normal ?take off from the left coronary cusp that trifurcates to form a left ?anterior descending artery, ramus intermedius, and a left  circumflex ?artery. There is minimal non-calcified plaque (<25%). ?  ?Left anterior descending artery: The proximal LAD is diffusely ?diseased with mixed density plaque (25-49%). The mid LAD contains ?minimal calcified plaque (<25%). The distal LAD is patent. The LAD ?gives off 2 patent diagonal branches. ?  ?Ramus intermedius: Small vessel with no significant stenosis. ?  ?Left circumflex artery: The LCX is non-dominant. The proximal LCX ?contains minimal non-calcified plaque (<25%). The first OM branch ?contains minimal non-calcified plaque (<25%). The LCX terminates as ?a small OM branch that is patent. ?  ?Right coronary artery: The RCA is dominant with normal take off from ?the right coronary cusp. The proximal RCA contains  minimal calcified ?plaque (<25%). The mid RCA contains mild non-calcified plaque ?(25-49%). The distal RCA contains minimal calcified plaque (<25%). ?The RCA terminates as a PDA and right posterolateral branch without ?evidence of plaque or stenosis. ?  ? ?IMPRESSION: ?1. Coronary calcium score of 121. This was 68th percentile for age ?and sex matched controls. ?  ?2. Normal coronary origin with right dominance. ?  ?3. Mild CAD in the proximal LAD (25-49%) and mid RCA (25-49%). ?  ?RECOMMENDATIONS: ?1. CT FFR will be sent due to diffuse mild CAD in the proximal LAD. ?Consider preventive therapy and risk factor modification. ? ?1. Left Main: 0.98; no significant stenosis. ?  ?2. Proximal LAD: 0.93; no significant stenosis. ?3. Mid LAD: 0.88; no significant stenosis. ?4. LCX: 0.94; no significant stenosis. ?5. Proximal RCA: 0.95; no significant stenosis. ?6. Mid RCA: 0.83; no significant stenosis. ?  ?IMPRESSION: ?1.  CT FFR analysis didn't show any significant stenosis ? ?EKG:  EKG is personally reviewed.   ?05/01/2022: NSR at 83 bpm with PVC ?10/03/20: NSR at 71 bpm with nonspecific NT pattern ? ?Recent Labs: ?No results found for requested labs within last 8760 hours.  ? ?Recent Lipid Panel ?    ?Component Value Date/Time  ? CHOL 174 04/17/2021 0935  ? TRIG 182 (H) 04/17/2021 0935  ? HDL 57 04/17/2021 0935  ? CHOLHDL 3.1 04/17/2021 0935  ? [place] 86 04/17/2021 0935  ? ? ?Physical Exam:   ? ?VS:  BP 112

## 2022-05-01 NOTE — Patient Instructions (Signed)
Medication Instructions:  Your physician recommends that you continue on your current medications as directed. Please refer to the Current Medication list given to you today.   *If you need a refill on your cardiac medications before your next appointment, please call your pharmacy*  Lab Work: NONE  Testing/Procedures: NONE  Follow-Up: At [facility], you and your health needs are our priority.  As part of our continuing mission to provide you with exceptional heart care, we have created designated Provider Care Teams.  These Care Teams include your primary Cardiologist (physician) and Advanced Practice Providers (APPs -  Physician Assistants and Nurse Practitioners) who all work together to provide you with the care you need, when you need it.  We recommend signing up for the patient portal called "MyChart".  Sign up information is provided on this After Visit Summary.  MyChart is used to connect with patients for Virtual Visits (Telemedicine).  Patients are able to view lab/test results, encounter notes, upcoming appointments, etc.  Non-urgent messages can be sent to your provider as well.   To learn more about what you can do with MyChart, go to [email or web address].    Your next appointment:   12 month(s)  The format for your next appointment:   In Person  Provider:   Bridgette Christopher, MD     

## 2022-06-09 ENCOUNTER — Other Ambulatory Visit: Payer: Self-pay | Admitting: Family Medicine

## 2022-06-09 DIAGNOSIS — Z1231 Encounter for screening mammogram for malignant neoplasm of breast: Secondary | ICD-10-CM

## 2022-06-26 ENCOUNTER — Ambulatory Visit
Admission: RE | Admit: 2022-06-26 | Discharge: 2022-06-26 | Disposition: A | Discharge status: Home / Self Care | Payer: Medicare Other | Source: Ambulatory Visit | Attending: Family Medicine | Admitting: Family Medicine

## 2022-06-26 DIAGNOSIS — Z1231 Encounter for screening mammogram for malignant neoplasm of breast: Secondary | ICD-10-CM | POA: Diagnosis not present

## 2022-07-10 DIAGNOSIS — L821 Other seborrheic keratosis: Secondary | ICD-10-CM | POA: Diagnosis not present

## 2022-07-10 DIAGNOSIS — D1801 Hemangioma of skin and subcutaneous tissue: Secondary | ICD-10-CM | POA: Diagnosis not present

## 2022-07-10 DIAGNOSIS — L814 Other melanin hyperpigmentation: Secondary | ICD-10-CM | POA: Diagnosis not present

## 2022-07-10 DIAGNOSIS — D225 Melanocytic nevi of trunk: Secondary | ICD-10-CM | POA: Diagnosis not present

## 2022-07-17 DIAGNOSIS — M25512 Pain in left shoulder: Secondary | ICD-10-CM | POA: Diagnosis not present

## 2022-07-17 DIAGNOSIS — Z6831 Body mass index (BMI) 31.0-31.9, adult: Secondary | ICD-10-CM | POA: Diagnosis not present

## 2022-07-17 DIAGNOSIS — M353 Polymyalgia rheumatica: Secondary | ICD-10-CM | POA: Diagnosis not present

## 2022-07-17 DIAGNOSIS — M25511 Pain in right shoulder: Secondary | ICD-10-CM | POA: Diagnosis not present

## 2022-07-17 DIAGNOSIS — E669 Obesity, unspecified: Secondary | ICD-10-CM | POA: Diagnosis not present

## 2022-07-24 ENCOUNTER — Other Ambulatory Visit: Payer: Self-pay | Admitting: Family Medicine

## 2022-07-24 DIAGNOSIS — R7989 Other specified abnormal findings of blood chemistry: Secondary | ICD-10-CM

## 2022-07-31 ENCOUNTER — Other Ambulatory Visit: Payer: Medicare Other

## 2022-08-06 ENCOUNTER — Ambulatory Visit
Admission: RE | Admit: 2022-08-06 | Discharge: 2022-08-06 | Disposition: A | Discharge status: Home / Self Care | Payer: Medicare Other | Source: Ambulatory Visit | Attending: Family Medicine | Admitting: Family Medicine

## 2022-08-06 DIAGNOSIS — R7989 Other specified abnormal findings of blood chemistry: Secondary | ICD-10-CM

## 2022-08-06 DIAGNOSIS — K8020 Calculus of gallbladder without cholecystitis without obstruction: Secondary | ICD-10-CM | POA: Diagnosis not present

## 2022-08-06 DIAGNOSIS — R945 Abnormal results of liver function studies: Secondary | ICD-10-CM | POA: Diagnosis not present

## 2022-08-27 DIAGNOSIS — E119 Type 2 diabetes mellitus without complications: Secondary | ICD-10-CM | POA: Diagnosis not present

## 2022-09-17 DIAGNOSIS — R7989 Other specified abnormal findings of blood chemistry: Secondary | ICD-10-CM | POA: Diagnosis not present

## 2022-09-17 DIAGNOSIS — Z6831 Body mass index (BMI) 31.0-31.9, adult: Secondary | ICD-10-CM | POA: Diagnosis not present

## 2022-09-17 DIAGNOSIS — M25512 Pain in left shoulder: Secondary | ICD-10-CM | POA: Diagnosis not present

## 2022-09-17 DIAGNOSIS — M25511 Pain in right shoulder: Secondary | ICD-10-CM | POA: Diagnosis not present

## 2022-09-17 DIAGNOSIS — M1991 Primary osteoarthritis, unspecified site: Secondary | ICD-10-CM | POA: Diagnosis not present

## 2022-09-17 DIAGNOSIS — M353 Polymyalgia rheumatica: Secondary | ICD-10-CM | POA: Diagnosis not present

## 2022-09-17 DIAGNOSIS — E669 Obesity, unspecified: Secondary | ICD-10-CM | POA: Diagnosis not present

## 2022-09-19 DIAGNOSIS — Z1389 Encounter for screening for other disorder: Secondary | ICD-10-CM | POA: Diagnosis not present

## 2022-09-19 DIAGNOSIS — Z6831 Body mass index (BMI) 31.0-31.9, adult: Secondary | ICD-10-CM | POA: Diagnosis not present

## 2022-09-19 DIAGNOSIS — Z0000 Encounter for general adult medical examination without abnormal findings: Secondary | ICD-10-CM | POA: Diagnosis not present

## 2022-09-24 DIAGNOSIS — G4700 Insomnia, unspecified: Secondary | ICD-10-CM | POA: Diagnosis not present

## 2022-09-24 DIAGNOSIS — E1169 Type 2 diabetes mellitus with other specified complication: Secondary | ICD-10-CM | POA: Diagnosis not present

## 2022-09-24 DIAGNOSIS — Z23 Encounter for immunization: Secondary | ICD-10-CM | POA: Diagnosis not present

## 2022-09-24 DIAGNOSIS — I10 Essential (primary) hypertension: Secondary | ICD-10-CM | POA: Diagnosis not present

## 2022-09-24 DIAGNOSIS — E785 Hyperlipidemia, unspecified: Secondary | ICD-10-CM | POA: Diagnosis not present

## 2022-09-24 DIAGNOSIS — F411 Generalized anxiety disorder: Secondary | ICD-10-CM | POA: Diagnosis not present

## 2022-09-24 DIAGNOSIS — E890 Postprocedural hypothyroidism: Secondary | ICD-10-CM | POA: Diagnosis not present

## 2022-09-24 DIAGNOSIS — K760 Fatty (change of) liver, not elsewhere classified: Secondary | ICD-10-CM | POA: Diagnosis not present

## 2022-09-24 DIAGNOSIS — E669 Obesity, unspecified: Secondary | ICD-10-CM | POA: Diagnosis not present

## 2022-12-08 DIAGNOSIS — M25511 Pain in right shoulder: Secondary | ICD-10-CM | POA: Diagnosis not present

## 2022-12-08 DIAGNOSIS — R7989 Other specified abnormal findings of blood chemistry: Secondary | ICD-10-CM | POA: Diagnosis not present

## 2022-12-08 DIAGNOSIS — M353 Polymyalgia rheumatica: Secondary | ICD-10-CM | POA: Diagnosis not present

## 2022-12-08 DIAGNOSIS — M1991 Primary osteoarthritis, unspecified site: Secondary | ICD-10-CM | POA: Diagnosis not present

## 2022-12-08 DIAGNOSIS — E669 Obesity, unspecified: Secondary | ICD-10-CM | POA: Diagnosis not present

## 2022-12-08 DIAGNOSIS — Z6831 Body mass index (BMI) 31.0-31.9, adult: Secondary | ICD-10-CM | POA: Diagnosis not present

## 2022-12-08 DIAGNOSIS — M25512 Pain in left shoulder: Secondary | ICD-10-CM | POA: Diagnosis not present

## 2023-01-19 DIAGNOSIS — N898 Other specified noninflammatory disorders of vagina: Secondary | ICD-10-CM | POA: Diagnosis not present

## 2023-03-10 DIAGNOSIS — M25512 Pain in left shoulder: Secondary | ICD-10-CM | POA: Diagnosis not present

## 2023-03-10 DIAGNOSIS — Z6829 Body mass index (BMI) 29.0-29.9, adult: Secondary | ICD-10-CM | POA: Diagnosis not present

## 2023-03-10 DIAGNOSIS — M353 Polymyalgia rheumatica: Secondary | ICD-10-CM | POA: Diagnosis not present

## 2023-03-10 DIAGNOSIS — E663 Overweight: Secondary | ICD-10-CM | POA: Diagnosis not present

## 2023-03-10 DIAGNOSIS — R7989 Other specified abnormal findings of blood chemistry: Secondary | ICD-10-CM | POA: Diagnosis not present

## 2023-03-10 DIAGNOSIS — M1991 Primary osteoarthritis, unspecified site: Secondary | ICD-10-CM | POA: Diagnosis not present

## 2023-03-10 DIAGNOSIS — M25511 Pain in right shoulder: Secondary | ICD-10-CM | POA: Diagnosis not present

## 2023-03-17 DIAGNOSIS — I10 Essential (primary) hypertension: Secondary | ICD-10-CM | POA: Diagnosis not present

## 2023-03-17 DIAGNOSIS — E559 Vitamin D deficiency, unspecified: Secondary | ICD-10-CM | POA: Diagnosis not present

## 2023-03-17 DIAGNOSIS — Z6830 Body mass index (BMI) 30.0-30.9, adult: Secondary | ICD-10-CM | POA: Diagnosis not present

## 2023-03-17 DIAGNOSIS — E1169 Type 2 diabetes mellitus with other specified complication: Secondary | ICD-10-CM | POA: Diagnosis not present

## 2023-03-17 DIAGNOSIS — E785 Hyperlipidemia, unspecified: Secondary | ICD-10-CM | POA: Diagnosis not present

## 2023-03-17 DIAGNOSIS — E041 Nontoxic single thyroid nodule: Secondary | ICD-10-CM | POA: Diagnosis not present

## 2023-03-25 DIAGNOSIS — M353 Polymyalgia rheumatica: Secondary | ICD-10-CM | POA: Diagnosis not present

## 2023-03-25 DIAGNOSIS — Z6830 Body mass index (BMI) 30.0-30.9, adult: Secondary | ICD-10-CM | POA: Diagnosis not present

## 2023-03-25 DIAGNOSIS — I10 Essential (primary) hypertension: Secondary | ICD-10-CM | POA: Diagnosis not present

## 2023-03-25 DIAGNOSIS — E041 Nontoxic single thyroid nodule: Secondary | ICD-10-CM | POA: Diagnosis not present

## 2023-03-25 DIAGNOSIS — R748 Abnormal levels of other serum enzymes: Secondary | ICD-10-CM | POA: Diagnosis not present

## 2023-03-25 DIAGNOSIS — K760 Fatty (change of) liver, not elsewhere classified: Secondary | ICD-10-CM | POA: Diagnosis not present

## 2023-03-25 DIAGNOSIS — E785 Hyperlipidemia, unspecified: Secondary | ICD-10-CM | POA: Diagnosis not present

## 2023-03-25 DIAGNOSIS — I700 Atherosclerosis of aorta: Secondary | ICD-10-CM | POA: Diagnosis not present

## 2023-03-25 DIAGNOSIS — E1169 Type 2 diabetes mellitus with other specified complication: Secondary | ICD-10-CM | POA: Diagnosis not present

## 2023-03-25 DIAGNOSIS — M542 Cervicalgia: Secondary | ICD-10-CM | POA: Diagnosis not present

## 2023-05-18 ENCOUNTER — Ambulatory Visit (INDEPENDENT_AMBULATORY_CARE_PROVIDER_SITE_OTHER): Payer: Medicare Other | Admitting: Orthopedic Surgery

## 2023-05-18 ENCOUNTER — Other Ambulatory Visit (INDEPENDENT_AMBULATORY_CARE_PROVIDER_SITE_OTHER): Payer: Medicare Other

## 2023-05-18 ENCOUNTER — Encounter: Payer: Self-pay | Admitting: Orthopedic Surgery

## 2023-05-18 ENCOUNTER — Other Ambulatory Visit: Payer: Self-pay

## 2023-05-18 DIAGNOSIS — M12812 Other specific arthropathies, not elsewhere classified, left shoulder: Secondary | ICD-10-CM | POA: Diagnosis not present

## 2023-05-18 DIAGNOSIS — M25512 Pain in left shoulder: Secondary | ICD-10-CM | POA: Diagnosis not present

## 2023-05-18 MED ORDER — BUPIVACAINE HCL 0.5 % IJ SOLN
9.0000 mL | INTRAMUSCULAR | Status: AC | PRN
  Administered 2023-05-18: 9 mL via INTRA_ARTICULAR

## 2023-05-18 MED ORDER — METHYLPREDNISOLONE ACETATE 40 MG/ML IJ SUSP
40.0000 mg | INTRAMUSCULAR | Status: AC | PRN
  Administered 2023-05-18: 40 mg via INTRA_ARTICULAR

## 2023-05-18 MED ORDER — LIDOCAINE HCL 1 % IJ SOLN
5.0000 mL | INTRAMUSCULAR | Status: AC | PRN
  Administered 2023-05-18: 5 mL

## 2023-05-18 NOTE — Progress Notes (Signed)
Office Visit Note   Patient: Sheena Trevino           Date of Birth: January 01, 1946           MRN: [id number] Visit Date: 05/18/2023 Requested by: Sigmund Hazel, MD [address] [place],  [place] [address] PCP: Sigmund Hazel, MD  Subjective: Chief Complaint  Patient presents with   Left Shoulder - Pain    HPI: Sheena Trevino is a 77 y.o. female who presents to the office reporting left shoulder pain.  She fell 2 weeks ago and caught herself with the left arm.  Pain wakes her at night.  Reports decreased range of motion as well as some pain posteriorly.  MRI scan from 2019 reviewed and she does have a full-thickness retracted supraspinatus tear at that time.  Has been taking prednisone for fibromyalgia along with Tylenol.  Has been hurting her on and off for 1 and half years.  She states that she can handle it.  Does hurt her to go overhead.  Hurts her at night as well..                ROS: All systems reviewed are negative as they relate to the chief complaint within the history of present illness.  Patient denies fevers or chills.  Assessment & Plan: Visit Diagnoses:  1. Acute pain of left shoulder     Plan: Impression is likely exacerbation of very mild left shoulder rotator cuff arthropathy.  She still has forward flexion abduction above 90 degrees along with good passive range of motion.  Rotator cuff strength is about 5- out of 5 on both sides.  S with external rotation.  Subscap strength 5+ out of 5 bilaterally.  Plan at this time is ultrasound-guided glenohumeral joint injection.  I do not think this injury will likely rise to the level of requiring surgical intervention.  Will see how she does with the injection and follow-up as needed.  Follow-Up Instructions: No follow-ups on file.   Orders:  Orders Placed This Encounter  Procedures   XR Shoulder Left   US Guided Needle Placement - No Linked Charges   No orders of the defined types were placed in this encounter.      Procedures: Large Joint Inj: L glenohumeral on 05/18/2023 4:10 PM Indications: diagnostic evaluation and pain Details: 18 G 1.5 in needle, ultrasound-guided posterior approach  Arthrogram: No  Medications: 9 mL bupivacaine 0.5 %; 40 mg methylPREDNISolone acetate 40 MG/ML; 5 mL lidocaine 1 % Outcome: tolerated well, no immediate complications Procedure, treatment alternatives, risks and benefits explained, specific risks discussed. Consent was given by the patient. Immediately prior to procedure a time out was called to verify the correct patient, procedure, equipment, support staff and site/side marked as required. Patient was prepped and draped in the usual sterile fashion.       Clinical Data: No additional findings.  Objective: Vital Signs: There were no vitals taken for this visit.  Physical Exam:  Constitutional: Patient appears well-developed HEENT:  Head: Normocephalic Eyes:EOM are normal Neck: Normal range of motion Cardiovascular: Normal rate Pulmonary/chest: Effort normal Neurologic: Patient is alert Skin: Skin is warm Psychiatric: Patient has normal mood and affect  Ortho Exam: Ortho exam demonstrates some weakness to external rotation strength testing bilaterally at 4 out of 5 in both shoulders.  Subscap strength 5+ out of 5.  Deltoid is functional.  Shoulder is located.  Mild crepitus is present on the left-hand side with internal/external  rotation 9 degrees of abduction but no AC joint tenderness is present.  Motor or sensory function present and normal in both hands.  Neck range of motion intact.  No Popeye deformity  Specialty Comments:  No specialty comments available.  Imaging: No results found.   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   Past Medical History:  Diagnosis Date   Anemia    in late twenties   Anxiety     Arthritis    "knees, fingers" (09/05/2014)   Diabetes mellitus without complication (HCC)    GERD (gastroesophageal reflux disease)    H/O hiatal hernia    Hyperlipidemia    Hypertension    Insomnia    Irritable bowel    with stress   Obesity    PONV (postoperative nausea and vomiting)     Family History  Problem Relation Age of Onset   Cancer Mother        lung   Heart disease Father    Heart attack Father    Diabetes Sister    Leukemia Brother    Heart attack Paternal Grandfather    Breast cancer Neg Hx     Past Surgical History:  Procedure Laterality Date   ABDOMINAL HYSTERECTOMY  1982   partial   BREAST BIOPSY Left    COLONOSCOPY     FOOT SURGERY Right ~ 2004   "top of my foot"   SHOULDER ARTHROSCOPY WITH OPEN ROTATOR CUFF REPAIR Right ~ 2000   THYROID LOBECTOMY Right 09/05/2014   THYROID LOBECTOMY Right 09/05/2014   Procedure: RIGHT THYROID LOBECTOMY;  Surgeon: Darnell Level, MD;  Location: [facility] OR;  Service: General;  Laterality: Right;   TUBAL LIGATION  1981   Social History   Occupational History   Not on file  Tobacco Use   Smoking status: Never   Smokeless tobacco: Never  Substance and Sexual Activity   Alcohol use: No   Drug use: No   Sexual activity: Not Currently

## 2023-06-09 DIAGNOSIS — R7989 Other specified abnormal findings of blood chemistry: Secondary | ICD-10-CM | POA: Diagnosis not present

## 2023-06-09 DIAGNOSIS — Z6829 Body mass index (BMI) 29.0-29.9, adult: Secondary | ICD-10-CM | POA: Diagnosis not present

## 2023-06-09 DIAGNOSIS — M353 Polymyalgia rheumatica: Secondary | ICD-10-CM | POA: Diagnosis not present

## 2023-06-09 DIAGNOSIS — M1991 Primary osteoarthritis, unspecified site: Secondary | ICD-10-CM | POA: Diagnosis not present

## 2023-06-09 DIAGNOSIS — M25511 Pain in right shoulder: Secondary | ICD-10-CM | POA: Diagnosis not present

## 2023-06-09 DIAGNOSIS — M25512 Pain in left shoulder: Secondary | ICD-10-CM | POA: Diagnosis not present

## 2023-06-09 DIAGNOSIS — E663 Overweight: Secondary | ICD-10-CM | POA: Diagnosis not present

## 2023-07-07 ENCOUNTER — Other Ambulatory Visit: Payer: Self-pay | Admitting: Family Medicine

## 2023-07-07 DIAGNOSIS — Z1231 Encounter for screening mammogram for malignant neoplasm of breast: Secondary | ICD-10-CM

## 2023-07-22 ENCOUNTER — Ambulatory Visit: Admission: RE | Admit: 2023-07-22 | Payer: Medicare Other | Source: Ambulatory Visit

## 2023-07-22 DIAGNOSIS — Z1231 Encounter for screening mammogram for malignant neoplasm of breast: Secondary | ICD-10-CM

## 2023-08-11 DIAGNOSIS — H2513 Age-related nuclear cataract, bilateral: Secondary | ICD-10-CM | POA: Diagnosis not present

## 2023-08-11 DIAGNOSIS — H25043 Posterior subcapsular polar age-related cataract, bilateral: Secondary | ICD-10-CM | POA: Diagnosis not present

## 2023-08-11 DIAGNOSIS — H18413 Arcus senilis, bilateral: Secondary | ICD-10-CM | POA: Diagnosis not present

## 2023-08-11 DIAGNOSIS — H353131 Nonexudative age-related macular degeneration, bilateral, early dry stage: Secondary | ICD-10-CM | POA: Diagnosis not present

## 2023-08-11 DIAGNOSIS — H2512 Age-related nuclear cataract, left eye: Secondary | ICD-10-CM | POA: Diagnosis not present

## 2023-08-12 ENCOUNTER — Encounter: Payer: Self-pay | Admitting: Orthopedic Surgery

## 2023-08-12 ENCOUNTER — Ambulatory Visit: Payer: Medicare Other | Admitting: Orthopedic Surgery

## 2023-08-12 DIAGNOSIS — G5601 Carpal tunnel syndrome, right upper limb: Secondary | ICD-10-CM | POA: Diagnosis not present

## 2023-08-12 NOTE — Progress Notes (Signed)
Office Visit Note   Patient: Sheena Trevino           Date of Birth: July 13, 1946           MRN: [id number] Visit Date: 08/12/2023 Requested by: Sigmund Hazel, MD [address] [place],  [place] [address] PCP: Sigmund Hazel, MD  Subjective: Chief Complaint  Patient presents with   Other    Bilateral hand numbness and pain    HPI: Sheena Trevino is a 77 y.o. female who presents to the office reporting bilateral wrist and hand pain right worse than left.  She is right-hand dominant.  The pain wakes her from sleep at night.  Denies any decrease in strength.  Not really dropping things but she does report numbness and tingling primarily in digits 1 2 and 3.  No pain when she makes a fist.  Holding a phone and working on the computer causes increased in the numbness and tingling in her hands.  Been going on for 2 months off and on.  She does a lot of computer work which bothers her hands as well as place computer games.  Has not really used much in terms of night splinting yet..                ROS: All systems reviewed are negative as they relate to the chief complaint within the history of present illness.  Patient denies fevers or chills.  Assessment & Plan: Visit Diagnoses:  1. Carpal tunnel syndrome, right upper limb     Plan: Impression is bilateral carpal tunnel syndrome.  Does not appear to be coming from the neck or the elbow.  Plan nerve conduction study right hand to evaluate carpal tunnel syndrome and we will get her using a night splint as well.  Follow-up after that study.  Follow-Up Instructions: No follow-ups on file.   Orders:  No orders of the defined types were placed in this encounter.  No orders of the defined types were placed in this encounter.     Procedures: No procedures performed   Clinical Data: No additional findings.  Objective: Vital Signs: There were no vitals taken for this visit.  Physical Exam:  Constitutional: Patient appears  well-developed HEENT:  Head: Normocephalic Eyes:EOM are normal Neck: Normal range of motion Cardiovascular: Normal rate Pulmonary/chest: Effort normal Neurologic: Patient is alert Skin: Skin is warm Psychiatric: Patient has normal mood and affect  Ortho Exam: Ortho exam demonstrates good cervical spine range of motion.  5 out of 5 grip EPL FPL interosseous are/extension biceps triceps and deltoid strength.  Does have paresthesias digits 1 2 and 3 on the right-hand side less on the left-hand side.  Elbow range of motion is full.  Negative Tinel's cubital tunnel in the elbow.  Radial pulse intact on the right-hand side.  Full composite finger flexion and extension on the right.  No abductor pollicis brevis wasting.  Specialty Comments:  No specialty comments available.  Imaging: No results found.   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   Past Medical History:  Diagnosis Date   Anemia    in late twenties   Anxiety    Arthritis    "knees, fingers" (09/05/2014)   Diabetes mellitus without complication (HCC)    GERD (gastroesophageal reflux disease)    H/O hiatal hernia    Hyperlipidemia  Hypertension    Insomnia    Irritable bowel    with stress   Obesity    PONV (postoperative nausea and vomiting)     Family History  Problem Relation Age of Onset   Cancer Mother        lung   Heart disease Father    Heart attack Father    Diabetes Sister    Leukemia Brother    Heart attack Paternal Grandfather    Breast cancer Neg Hx     Past Surgical History:  Procedure Laterality Date   ABDOMINAL HYSTERECTOMY  1982   partial   BREAST BIOPSY Left    COLONOSCOPY     FOOT SURGERY Right ~ 2004   "top of my foot"   SHOULDER ARTHROSCOPY WITH OPEN ROTATOR CUFF REPAIR Right ~ 2000   THYROID LOBECTOMY Right 09/05/2014   THYROID LOBECTOMY Right 09/05/2014    Procedure: RIGHT THYROID LOBECTOMY;  Surgeon: Darnell Level, MD;  Location: [facility] OR;  Service: General;  Laterality: Right;   TUBAL LIGATION  1981   Social History   Occupational History   Not on file  Tobacco Use   Smoking status: Never   Smokeless tobacco: Never  Substance and Sexual Activity   Alcohol use: No   Drug use: No   Sexual activity: Not Currently

## 2023-08-19 ENCOUNTER — Encounter: Payer: Self-pay | Admitting: Physical Medicine and Rehabilitation

## 2023-08-19 ENCOUNTER — Ambulatory Visit (INDEPENDENT_AMBULATORY_CARE_PROVIDER_SITE_OTHER): Payer: Medicare Other | Admitting: Physical Medicine and Rehabilitation

## 2023-08-19 DIAGNOSIS — G8929 Other chronic pain: Secondary | ICD-10-CM | POA: Diagnosis not present

## 2023-08-19 DIAGNOSIS — M79601 Pain in right arm: Secondary | ICD-10-CM

## 2023-08-19 DIAGNOSIS — E119 Type 2 diabetes mellitus without complications: Secondary | ICD-10-CM | POA: Insufficient documentation

## 2023-08-19 DIAGNOSIS — R202 Paresthesia of skin: Secondary | ICD-10-CM

## 2023-08-19 DIAGNOSIS — M79641 Pain in right hand: Secondary | ICD-10-CM | POA: Diagnosis not present

## 2023-08-19 DIAGNOSIS — M25511 Pain in right shoulder: Secondary | ICD-10-CM

## 2023-08-19 NOTE — Progress Notes (Signed)
Functional Pain Scale - descriptive words and definitions  Uncomfortable (3)  Pain is present but can complete all ADL's/sleep is slightly affected and passive distraction only gives marginal relief. Mild range order  Average Pain 5-6  Right handed. Right hand pain and numbness in all fingers except pinky. Pain radiates into the mid forearm

## 2023-08-25 NOTE — Procedures (Signed)
EMG & NCV Findings: Evaluation of the left median motor nerve showed prolonged distal onset latency (7.0 ms) and decreased conduction velocity (Elbow-Wrist, 47 m/s).  The right median motor nerve showed prolonged distal onset latency (7.0 ms), reduced amplitude (4.3 mV), and decreased conduction velocity (Elbow-Wrist, 46 m/s).  The left median (across palm) sensory and the right median (across palm) sensory nerves showed prolonged distal peak latency (Wrist, L7.2, R6.9 ms), reduced amplitude (L7.7, R7.1 V), and prolonged distal peak latency (Palm, L3.3, R3.3 ms).  All remaining nerves (as indicated in the following tables) were within normal limits.  All left vs. right side differences were within normal limits.    All examined muscles (as indicated in the following table) showed no evidence of electrical instability.    Impression: The above electrodiagnostic study is ABNORMAL and reveals evidence of:  a severe right median nerve entrapment at the wrist (carpal tunnel syndrome) affecting sensory and motor components.  a moderate to severe left median nerve entrapment at the wrist (carpal tunnel syndrome) affecting sensory and motor components.  There is no significant electrodiagnostic evidence of any other focal nerve entrapment, brachial plexopathy or cervical radiculopathy.     Recommendations: 1.  Follow-up with referring physician. 2.  Continue current management of symptoms. 3.  Continue use of resting splint at night-time and as needed during the day. 4.  Suggest surgical evaluation.  ___________________________ Naaman Plummer FAAPMR Board Certified, American Board of Physical Medicine and Rehabilitation    Nerve Conduction Studies Anti Sensory Summary Table   Stim Site NR Peak (ms) Norm Peak (ms) P-T Amp (V) Norm P-T Amp Site1 Site2 Delta-P (ms) Dist (cm) Vel (m/s) Norm Vel (m/s)  Left Median Acr Palm Anti Sensory (2nd Digit)  31.1C  Wrist    *7.2 <3.6 *7.7 >10 Wrist Palm 3.9  0.0    Palm    *3.3 <2.0 2.2         Right Median Acr Palm Anti Sensory (2nd Digit)  30.4C  Wrist    *6.9 <3.6 *7.1 >10 Wrist Palm 3.6 0.0    Palm    *3.3 <2.0 2.1         Right Radial Anti Sensory (Base 1st Digit)  30.7C  Wrist    2.3 <3.1 20.8  Wrist Base 1st Digit 2.3 0.0    Right Ulnar Anti Sensory (5th Digit)  30.9C  Wrist    3.3 <3.7 19.1 >15.0 Wrist 5th Digit 3.3 14.0 42 >38   Motor Summary Table   Stim Site NR Onset (ms) Norm Onset (ms) O-P Amp (mV) Norm O-P Amp Site1 Site2 Delta-0 (ms) Dist (cm) Vel (m/s) Norm Vel (m/s)  Left Median Motor (Abd Poll Brev)  31.2C  Wrist    *7.0 <4.2 5.8 >5 Elbow Wrist 4.4 20.5 *47 >50  Elbow    11.4  3.8         Right Median Motor (Abd Poll Brev)  30.8C  Wrist    *7.0 <4.2 *4.3 >5 Elbow Wrist 4.6 21.0 *46 >50  Elbow    11.6  3.9         Right Ulnar Motor (Abd Dig Min)  31C  Wrist    3.0 <4.2 6.9 >3 B Elbow Wrist 3.3 19.0 58 >53  B Elbow    6.3  7.1  A Elbow B Elbow 1.2 10.0 83 >53  A Elbow    7.5  5.9          EMG   Side Muscle  Nerve Root Ins Act Fibs Psw Amp Dur Poly Recrt Int Dennie Bible Comment  Right Abd Poll Brev Median C8-T1 Nml Nml Nml Nml Nml 0 Nml Nml   Right 1stDorInt Ulnar C8-T1 Nml Nml Nml Nml Nml 0 Nml Nml   Right PronatorTeres Median C6-7 Nml Nml Nml Nml Nml 0 Nml Nml   Right Biceps Musculocut C5-6 Nml Nml Nml Nml Nml 0 Nml Nml   Right Deltoid Axillary C5-6 Nml Nml Nml Nml Nml 0 Nml Nml     Nerve Conduction Studies Anti Sensory Left/Right Comparison   Stim Site L Lat (ms) R Lat (ms) L-R Lat (ms) L Amp (V) R Amp (V) L-R Amp (%) Site1 Site2 L Vel (m/s) R Vel (m/s) L-R Vel (m/s)  Median Acr Palm Anti Sensory (2nd Digit)  31.1C  Wrist *7.2 *6.9 0.3 *7.7 *7.1 7.8 Wrist Palm     Palm *3.3 *3.3 0.0 2.2 2.1 4.5       Radial Anti Sensory (Base 1st Digit)  30.7C  Wrist  2.3   20.8  Wrist Base 1st Digit     Ulnar Anti Sensory (5th Digit)  30.9C  Wrist  3.3   19.1  Wrist 5th Digit  42    Motor Left/Right Comparison   Stim  Site L Lat (ms) R Lat (ms) L-R Lat (ms) L Amp (mV) R Amp (mV) L-R Amp (%) Site1 Site2 L Vel (m/s) R Vel (m/s) L-R Vel (m/s)  Median Motor (Abd Poll Brev)  31.2C  Wrist *7.0 *7.0 0.0 5.8 *4.3 25.9 Elbow Wrist *47 *46 1  Elbow 11.4 11.6 0.2 3.8 3.9 2.6       Ulnar Motor (Abd Dig Min)  31C  Wrist  3.0   6.9  B Elbow Wrist  58   B Elbow  6.3   7.1  A Elbow B Elbow  83   A Elbow  7.5   5.9           Waveforms:

## 2023-08-25 NOTE — Progress Notes (Signed)
Sheena Trevino - 77 y.o. female MRN [id number]  Date of birth: October 23, 1946  Office Visit Note: Visit Date: 08/19/2023 PCP: Sigmund Hazel, MD Referred by: Cammy Copa, MD  Subjective: Chief Complaint  Patient presents with   Right Hand - Pain, Numbness   HPI: Sheena Trevino is a 77 y.o. female who comes in today at the request of Dr. Burnard Bunting for evaluation and management of chronic, worsening and severe pain, numbness and tingling in the Bilateral upper extremities.  Patient is Right hand dominant.  She reports chronic now worsening for 2 to 3 months of hand pain right much more than left with paresthesias mainly in the radial digits but she also says the fifth digit on the right hand can get numb.  She gets increasing numbness and tingling with holding the phone and working on the computer.  She has a lot of nocturnal complaints.  She has had some splinting.  She does carry diagnosis of diabetes but no hemoglobin A1c that I could see and she is not on any medications currently.  She does have hypothyroidism.  No history of neck surgery or real neck pain in general.  Nothing shooting down the arms.  She reports left-sided symptoms are very similar to arrive just not nearly as severe and ongoing.  She does report weakness in the hands at times with grip strength but not really dropping objects.   I spent more than 30 minutes speaking face-to-face with the patient with 50% of the time in counseling and discussing coordination of care.      Review of Systems  Musculoskeletal:  Positive for joint pain.  Neurological:  Positive for tingling and weakness.  All other systems reviewed and are negative.  Otherwise per HPI.  Assessment & Plan: Visit Diagnoses:    ICD-10-CM   1. Paresthesia of skin  R20.2 NCV with EMG (electromyography)    2. Pain in right hand  M79.641     3. Chronic right shoulder pain  M25.511    G89.29     4. Right arm pain  M79.601        Plan:  Impression: Symptoms most consistent with some osteoarthritic change of both hands and also likely carpal tunnel syndrome and less likely radiculopathy.  Electrodiagnostic study performed today.  The above electrodiagnostic study is ABNORMAL and reveals evidence of:  a severe right median nerve entrapment at the wrist (carpal tunnel syndrome) affecting sensory and motor components.  a moderate to severe left median nerve entrapment at the wrist (carpal tunnel syndrome) affecting sensory and motor components.  There is no significant electrodiagnostic evidence of any other focal nerve entrapment, brachial plexopathy or cervical radiculopathy.     Recommendations: 1.  Follow-up with referring physician. 2.  Continue current management of symptoms. 3.  Continue use of resting splint at night-time and as needed during the day. 4.  Suggest surgical evaluation.  Meds & Orders: No orders of the defined types were placed in this encounter.   Orders Placed This Encounter  Procedures   NCV with EMG (electromyography)    Follow-up: Return for  G. Dorene Grebe, MD.   Procedures: No procedures performed  EMG & NCV Findings: Evaluation of the left median motor nerve showed prolonged distal onset latency (7.0 ms) and decreased conduction velocity (Elbow-Wrist, 47 m/s).  The right median motor nerve showed prolonged distal onset latency (7.0 ms), reduced amplitude (4.3 mV), and decreased conduction velocity (Elbow-Wrist, 46 m/s).  The  left median (across palm) sensory and the right median (across palm) sensory nerves showed prolonged distal peak latency (Wrist, L7.2, R6.9 ms), reduced amplitude (L7.7, R7.1 V), and prolonged distal peak latency (Palm, L3.3, R3.3 ms).  All remaining nerves (as indicated in the following tables) were within normal limits.  All left vs. right side differences were within normal limits.    All examined muscles (as indicated in the following table) showed no evidence of  electrical instability.    Impression: The above electrodiagnostic study is ABNORMAL and reveals evidence of:  a severe right median nerve entrapment at the wrist (carpal tunnel syndrome) affecting sensory and motor components.  a moderate to severe left median nerve entrapment at the wrist (carpal tunnel syndrome) affecting sensory and motor components.  There is no significant electrodiagnostic evidence of any other focal nerve entrapment, brachial plexopathy or cervical radiculopathy.     Recommendations: 1.  Follow-up with referring physician. 2.  Continue current management of symptoms. 3.  Continue use of resting splint at night-time and as needed during the day. 4.  Suggest surgical evaluation.  ___________________________ Naaman Plummer FAAPMR Board Certified, American Board of Physical Medicine and Rehabilitation    Nerve Conduction Studies Anti Sensory Summary Table   Stim Site NR Peak (ms) Norm Peak (ms) P-T Amp (V) Norm P-T Amp Site1 Site2 Delta-P (ms) Dist (cm) Vel (m/s) Norm Vel (m/s)  Left Median Acr Palm Anti Sensory (2nd Digit)  31.1C  Wrist    *7.2 <3.6 *7.7 >10 Wrist Palm 3.9 0.0    Palm    *3.3 <2.0 2.2         Right Median Acr Palm Anti Sensory (2nd Digit)  30.4C  Wrist    *6.9 <3.6 *7.1 >10 Wrist Palm 3.6 0.0    Palm    *3.3 <2.0 2.1         Right Radial Anti Sensory (Base 1st Digit)  30.7C  Wrist    2.3 <3.1 20.8  Wrist Base 1st Digit 2.3 0.0    Right Ulnar Anti Sensory (5th Digit)  30.9C  Wrist    3.3 <3.7 19.1 >15.0 Wrist 5th Digit 3.3 14.0 42 >38   Motor Summary Table   Stim Site NR Onset (ms) Norm Onset (ms) O-P Amp (mV) Norm O-P Amp Site1 Site2 Delta-0 (ms) Dist (cm) Vel (m/s) Norm Vel (m/s)  Left Median Motor (Abd Poll Brev)  31.2C  Wrist    *7.0 <4.2 5.8 >5 Elbow Wrist 4.4 20.5 *47 >50  Elbow    11.4  3.8         Right Median Motor (Abd Poll Brev)  30.8C  Wrist    *7.0 <4.2 *4.3 >5 Elbow Wrist 4.6 21.0 *46 >50  Elbow    11.6  3.9          Right Ulnar Motor (Abd Dig Min)  31C  Wrist    3.0 <4.2 6.9 >3 B Elbow Wrist 3.3 19.0 58 >53  B Elbow    6.3  7.1  A Elbow B Elbow 1.2 10.0 83 >53  A Elbow    7.5  5.9          EMG   Side Muscle Nerve Root Ins Act Fibs Psw Amp Dur Poly Recrt Int Dennie Bible Comment  Right Abd Poll Brev Median C8-T1 Nml Nml Nml Nml Nml 0 Nml Nml   Right 1stDorInt Ulnar C8-T1 Nml Nml Nml Nml Nml 0 Nml Nml   Right PronatorTeres Median C6-7 Nml Nml  Nml Nml Nml 0 Nml Nml   Right Biceps Musculocut C5-6 Nml Nml Nml Nml Nml 0 Nml Nml   Right Deltoid Axillary C5-6 Nml Nml Nml Nml Nml 0 Nml Nml     Nerve Conduction Studies Anti Sensory Left/Right Comparison   Stim Site L Lat (ms) R Lat (ms) L-R Lat (ms) L Amp (V) R Amp (V) L-R Amp (%) Site1 Site2 L Vel (m/s) R Vel (m/s) L-R Vel (m/s)  Median Acr Palm Anti Sensory (2nd Digit)  31.1C  Wrist *7.2 *6.9 0.3 *7.7 *7.1 7.8 Wrist Palm     Palm *3.3 *3.3 0.0 2.2 2.1 4.5       Radial Anti Sensory (Base 1st Digit)  30.7C  Wrist  2.3   20.8  Wrist Base 1st Digit     Ulnar Anti Sensory (5th Digit)  30.9C  Wrist  3.3   19.1  Wrist 5th Digit  42    Motor Left/Right Comparison   Stim Site L Lat (ms) R Lat (ms) L-R Lat (ms) L Amp (mV) R Amp (mV) L-R Amp (%) Site1 Site2 L Vel (m/s) R Vel (m/s) L-R Vel (m/s)  Median Motor (Abd Poll Brev)  31.2C  Wrist *7.0 *7.0 0.0 5.8 *4.3 25.9 Elbow Wrist *47 *46 1  Elbow 11.4 11.6 0.2 3.8 3.9 2.6       Ulnar Motor (Abd Dig Min)  31C  Wrist  3.0   6.9  B Elbow Wrist  58   B Elbow  6.3   7.1  A Elbow B Elbow  83   A Elbow  7.5   5.9           Waveforms:                 Clinical History: No specialty comments available.   She reports that she has never smoked. She has never used smokeless tobacco. No results for input(s): "HGBA1C", "LABURIC" in the last 8760 hours.  Objective:  VS:  HT:    WT:   BMI:     BP:   HR: bpm  TEMP: ( )  RESP:  Physical Exam Vitals and nursing note reviewed.  Constitutional:       General: She is not in acute distress.    Appearance: Normal appearance. She is well-developed. She is not ill-appearing.  HENT:     Head: Normocephalic and atraumatic.  Eyes:     Conjunctiva/sclera: Conjunctivae normal.     Pupils: Pupils are equal, round, and reactive to light.  Cardiovascular:     Rate and Rhythm: Normal rate.     Pulses: Normal pulses.  Pulmonary:     Effort: Pulmonary effort is normal.  Musculoskeletal:        General: No swelling, tenderness or deformity.     Right lower leg: No edema.     Left lower leg: No edema.     Comments: Inspection reveals no atrophy of the bilateral APB or FDI or hand intrinsics. There is no swelling, color changes, allodynia or dystrophic changes. There is 5 out of 5 strength in the bilateral wrist extension, finger abduction and long finger flexion. There is intact sensation to light touch in all dermatomal and peripheral nerve distributions. There is a negative Tinel's test at the bilateral wrist and elbow. There is a positive Phalen's test bilaterally. There is a negative Hoffmann's test bilaterally.  Skin:    General: Skin is warm and dry.     Findings: No erythema or  rash.  Neurological:     General: No focal deficit present.     Mental Status: She is alert and oriented to person, place, and time.     Cranial Nerves: No cranial nerve deficit.     Sensory: Sensory deficit present.     Motor: No weakness or abnormal muscle tone.     Coordination: Coordination normal.     Gait: Gait normal.  Psychiatric:        Mood and Affect: Mood normal.        Behavior: Behavior normal.     Ortho Exam  Imaging: No results found.  Past Medical/Family/Surgical/Social History: Medications & Allergies reviewed per EMR, new medications updated. 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   Past Medical History:  Diagnosis Date   Anemia    in late twenties   Anxiety    Arthritis    "knees, fingers" (09/05/2014)   Diabetes mellitus without complication (HCC)    GERD (gastroesophageal reflux disease)    H/O hiatal hernia    Hyperlipidemia    Hypertension    Insomnia    Irritable bowel    with stress   Obesity    PONV (postoperative nausea and vomiting)    Family History  Problem Relation Age of Onset   Cancer Mother        lung   Heart disease Father    Heart attack Father    Diabetes Sister    Leukemia Brother    Heart attack Paternal Grandfather    Breast cancer Neg Hx    Past Surgical History:  Procedure Laterality Date   ABDOMINAL HYSTERECTOMY  1982   partial   BREAST BIOPSY Left    COLONOSCOPY     FOOT SURGERY Right ~ 2004   "top of my foot"   SHOULDER ARTHROSCOPY WITH OPEN ROTATOR CUFF REPAIR Right ~ 2000   THYROID LOBECTOMY Right 09/05/2014   THYROID LOBECTOMY Right 09/05/2014   Procedure: RIGHT THYROID LOBECTOMY;  Surgeon: Darnell Level, MD;  Location: [facility];  Service: General;  Laterality: Right;   TUBAL LIGATION  1981   Social History   Occupational History   Not on file  Tobacco Use   Smoking status: Never   Smokeless tobacco: Never  Substance and Sexual Activity   Alcohol use: No   Drug use: No   Sexual activity: Not Currently

## 2023-09-04 ENCOUNTER — Telehealth: Payer: Self-pay | Admitting: Cardiology

## 2023-09-04 ENCOUNTER — Ambulatory Visit (INDEPENDENT_AMBULATORY_CARE_PROVIDER_SITE_OTHER): Payer: Medicare Other | Admitting: Orthopedic Surgery

## 2023-09-04 ENCOUNTER — Encounter (HOSPITAL_BASED_OUTPATIENT_CLINIC_OR_DEPARTMENT_OTHER): Payer: Self-pay | Admitting: Orthopedic Surgery

## 2023-09-04 ENCOUNTER — Other Ambulatory Visit: Payer: Self-pay

## 2023-09-04 ENCOUNTER — Encounter (HOSPITAL_BASED_OUTPATIENT_CLINIC_OR_DEPARTMENT_OTHER)
Admission: RE | Admit: 2023-09-04 | Discharge: 2023-09-04 | Disposition: A | Discharge status: Home / Self Care | Payer: Medicare Other | Source: Ambulatory Visit | Attending: Orthopedic Surgery

## 2023-09-04 ENCOUNTER — Encounter: Payer: Self-pay | Admitting: Orthopedic Surgery

## 2023-09-04 ENCOUNTER — Telehealth: Payer: Self-pay

## 2023-09-04 DIAGNOSIS — Z01818 Encounter for other preprocedural examination: Secondary | ICD-10-CM | POA: Diagnosis not present

## 2023-09-04 DIAGNOSIS — R202 Paresthesia of skin: Secondary | ICD-10-CM

## 2023-09-04 LAB — CBC
HCT: 40.4 % (ref 36.0–46.0)
Hemoglobin: 13.6 g/dL (ref 12.0–15.0)
MCH: 30.6 pg (ref 26.0–34.0)
MCHC: 33.7 g/dL (ref 30.0–36.0)
MCV: 91 fL (ref 80.0–100.0)
Platelets: 258 10*3/uL (ref 150–400)
RBC: 4.44 MIL/uL (ref 3.87–5.11)
RDW: 12.3 % (ref 11.5–15.5)
WBC: 10.6 10*3/uL — ABNORMAL HIGH (ref 4.0–10.5)
nRBC: 0 % (ref 0.0–0.2)

## 2023-09-04 MED ORDER — GABAPENTIN 100 MG PO CAPS: ORAL_CAPSULE | ORAL | 0 refills | Status: DC

## 2023-09-04 NOTE — Progress Notes (Signed)

## 2023-09-04 NOTE — Telephone Encounter (Signed)
Pt stated that she wanted to go ahead and schedule a tele appt. She is in extreme pain and that way she has an appt before ethey are full. She will be glad to change if she needs to but is eager to get upcoming procedure.

## 2023-09-04 NOTE — Telephone Encounter (Signed)
Added on today ok per KL.

## 2023-09-04 NOTE — Progress Notes (Signed)
ERROR

## 2023-09-04 NOTE — Progress Notes (Signed)
Called dr. Alfonso Patten office and left a message with need for ASA hold orders for this patient.

## 2023-09-04 NOTE — Progress Notes (Signed)
   09/04/23 1159  Pre-op Phone Call  Surgery Date Verified 09/07/23  Arrival Time Verified 1000  Surgery Location Verified [facility]  Medical History Reviewed Yes  Is the patient taking a GLP-1 receptor agonist? No  Does the patient have diabetes? Type II  Does the patient use a Continuous Blood Glucose Monitor? No  Is the patient on an insulin pump? No  Has the diabetes coordinator been notified? No  Do you have a history of heart problems? Yes  Cardiologist Name Dr. Cristal Deer  Have you ever had tests on your heart? Yes  What cardiac tests were performed? EKG;Echo;Labs  What date/year were cardiac tests completed? in epic  Results viewable: Care Everywhere  Does patient have other implanted devices? No  Patient Teaching Enhanced Recovery;Pre / Post Procedure  Patient educated about smoking cessation 24 hours prior to surgery. N/A Non-Smoker  Patient verbalizes understanding of bowel prep? N/A  THA/TKA patients only:  By your surgery date, will you have been taking narcotics for 90 days or greater? No  Med Rec Completed Yes  Take the Following Meds the Morning of Surgery Norvasc, levothyrosin,  Recent  Lab Work, EKG, CXR? No  NPO (Including gum & candy) After midnight  Allowed clear liquids Water;Gatorade  (diabetics please choose diet or no sugar options)  Patient instructed to stop clear liquids including Carb loading drink at: 0730  Stop Solids, Milk, Candy, and Gum STARTING AT MIDNIGHT  Did patient view EMMI videos? No  Responsible adult to drive and be with you for 24 hours? Yes  Name & Phone Number for Ride/Caregiver Friend  No Jewelry, money, nail polish or make-up.  No lotions, powders, perfumes. No shaving  48 hrs. prior to surgery. Yes  Contacts, Dentures & Glasses Will Have to be Removed Before OR. Yes  Please bring your ID and Insurance Card the morning of your surgery. (Surgery Centers Only) Yes  Bring any papers or x-rays with you that your surgeon gave you. Yes   Instructed to contact the location of procedure/ provider if they or anyone in their household develops symptoms or tests positive for COVID-19, has close contact with someone who tests positive for COVID, or has known exposure to any contagious illness. Yes  Call this number the morning of surgery  with any problems that may cancel your surgery. [phone]  Covid-19 Assessment  Have you had a positive COVID-19 test within the previous 90 days? No  COVID Testing Guidance Proceed with the additional questions.  Patient's surgery required a COVID-19 test (cardiothoracic, complex ENT, and bronchoscopies/ EBUS) No  Have you been unmasked and in close contact with anyone with COVID-19 or COVID-19 symptoms within the past 10 days? No  Do you or anyone in your household currently have any COVID-19 symptoms? No

## 2023-09-04 NOTE — Telephone Encounter (Signed)
  Patient Consent for Virtual Visit        Sheena Trevino has provided verbal consent on 09/04/2023 for a virtual visit (video or telephone).   CONSENT FOR VIRTUAL VISIT FOR:  Sheena Trevino  By participating in this virtual visit I agree to the following:  I hereby voluntarily request, consent and authorize [facility] and its employed or contracted physicians, physician assistants, nurse practitioners or other licensed health care professionals (the Practitioner), to provide me with telemedicine health care services (the "Services") as deemed necessary by the treating Practitioner. I acknowledge and consent to receive the Services by the Practitioner via telemedicine. I understand that the telemedicine visit will involve communicating with the Practitioner through live audiovisual communication technology and the disclosure of certain medical information by electronic transmission. I acknowledge that I have been given the opportunity to request an in-person assessment or other available alternative prior to the telemedicine visit and am voluntarily participating in the telemedicine visit.  I understand that I have the right to withhold or withdraw my consent to the use of telemedicine in the course of my care at any time, without affecting my right to future care or treatment, and that the Practitioner or I may terminate the telemedicine visit at any time. I understand that I have the right to inspect all information obtained and/or recorded in the course of the telemedicine visit and may receive copies of available information for a reasonable fee.  I understand that some of the potential risks of receiving the Services via telemedicine include:  Delay or interruption in medical evaluation due to technological equipment failure or disruption; Information transmitted may not be sufficient (e.g. poor resolution of images) to allow for appropriate medical decision making by the Practitioner;  and/or  In rare instances, security protocols could fail, causing a breach of personal health information.  Furthermore, I acknowledge that it is my responsibility to provide information about my medical history, conditions and care that is complete and accurate to the best of my ability. I acknowledge that Practitioner's advice, recommendations, and/or decision may be based on factors not within their control, such as incomplete or inaccurate data provided by me or distortions of diagnostic images or specimens that may result from electronic transmissions. I understand that the practice of medicine is not an exact science and that Practitioner makes no warranties or guarantees regarding treatment outcomes. I acknowledge that a copy of this consent can be made available to me via my patient portal [facility] MyChart), or I can request a printed copy by calling the office of [facility].    I understand that my insurance will be billed for this visit.   I have read or had this consent read to me. I understand the contents of this consent, which adequately explains the benefits and risks of the Services being provided via telemedicine.  I have been provided ample opportunity to ask questions regarding this consent and the Services and have had my questions answered to my satisfaction. I give my informed consent for the services to be provided through the use of telemedicine in my medical care

## 2023-09-04 NOTE — Telephone Encounter (Signed)
   Pre-operative Risk Assessment    Patient Name: Sheena Trevino  DOB: 1946-08-07 MRN: [id number]      Request for Surgical Clearance    Procedure:   right carpel tunnel  Date of Surgery:  Clearance 09/07/23                                 Surgeon:  Dr. Dorene Grebe Surgeon's Group or Practice Name:  [facility] Phone number:  [phone] Fax number:  [phone]   Type of Clearance Requested:   - Medical    Type of Anesthesia:   bier block   Additional requests/questions:      SignedFilomena Jungling   09/04/2023, 2:07 PM

## 2023-09-04 NOTE — Telephone Encounter (Signed)
Pt calling to f/u on Clearance for procedure that is scheduled to be done on Monday. Please advise

## 2023-09-04 NOTE — Progress Notes (Signed)
Office Visit Note   Patient: Sheena Trevino           Date of Birth: 1946/03/20           MRN: [id number] Visit Date: 09/04/2023 Requested by: Sigmund Hazel, MD [address] [place],  [place] [address] PCP: Sigmund Hazel, MD  Subjective: Chief Complaint  Patient presents with   Other    Review EMG/NCV    HPI: Sheena Trevino is a 77 y.o. female who presents to the office reporting significant bilateral hand pain with numbness and tingling.  The right she really cannot function with.  The left one is bad but describes it as "not horrible".  Patient is not on any anticoagulants.  Her diabetes is under control along with her hypertension.  EMG nerve study is reviewed and it does show severe carpal tunnel syndrome on the right and moderate to severe carpal tunnel syndrome on the left..                ROS: All systems reviewed are negative as they relate to the chief complaint within the history of present illness.  Patient denies fevers or chills.  Assessment & Plan: Visit Diagnoses: No diagnosis found.  Plan: Impression is relatively rapid onset of severe carpal tunnel syndrome likely coinciding with diminished steroid use from her rheumatologist.  She is reporting some weakness and loss of dexterity.  Based on the severity of symptoms as well as failure of conservative treatment carpal tunnel release is indicated.  We will try her on Neurontin 100 mg a day for some relief of symptoms until we get her posted.  The risk and benefits of surgery discussed include were not limited to infection nerve and vessel damage as well as incomplete restoration of hand function and incomplete resolution of numbness and tingling.  Sometimes with severe compression it can take days to weeks for the hand to improve.  All questions answered.  We will try to get this done sooner rather than later based on the severity of describes symptoms by the patient  Follow-Up Instructions: No follow-ups on file.   Orders:   No orders of the defined types were placed in this encounter.  Meds ordered this encounter  Medications   gabapentin (NEURONTIN) 100 MG capsule    Sig: 1 po q d prn    Dispense:  20 capsule    Refill:  0      Procedures: No procedures performed   Clinical Data: No additional findings.  Objective: Vital Signs: There were no vitals taken for this visit.  Physical Exam:  Constitutional: Patient appears well-developed HEENT:  Head: Normocephalic Eyes:EOM are normal Neck: Normal range of motion Cardiovascular: Normal rate Pulmonary/chest: Effort normal Neurologic: Patient is alert Skin: Skin is warm Psychiatric: Patient has normal mood and affect  Ortho Exam: Ortho exam demonstrates intact abductor pollicis brevis function bilaterally.  Negative Tinel's cubital tunnel in the elbow with no subluxation of the ulnar nerve.  Radial pulse intact bilaterally.  Patient has 5 out of 5 grip EPL FPL interosseous strength.  Wrist dorsiflexion palmar flexion intact.  Minimal wasting on the left of abductor pollicis brevis.  Slight wasting on the right.  Specialty Comments:  No specialty comments available.  Imaging: No results found.   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   Past Medical History:  Diagnosis Date   Anemia    in late twenties   Anxiety    Arthritis    "knees, fingers" (09/05/2014)   Diabetes mellitus without complication (HCC)    GERD (gastroesophageal reflux disease)    H/O hiatal hernia    Hyperlipidemia    Hypertension    Insomnia    Irritable bowel    with stress   Obesity    PONV (postoperative nausea and vomiting)     Family History  Problem Relation Age of Onset   Cancer Mother        lung   Heart disease Father    Heart attack Father     Diabetes Sister    Leukemia Brother    Heart attack Paternal Grandfather    Breast cancer Neg Hx     Past Surgical History:  Procedure Laterality Date   ABDOMINAL HYSTERECTOMY  1982   partial   BREAST BIOPSY Left    COLONOSCOPY     FOOT SURGERY Right ~ 2004   "top of my foot"   SHOULDER ARTHROSCOPY WITH OPEN ROTATOR CUFF REPAIR Right ~ 2000   THYROID LOBECTOMY Right 09/05/2014   THYROID LOBECTOMY Right 09/05/2014   Procedure: RIGHT THYROID LOBECTOMY;  Surgeon: Darnell Level, MD;  Location: [facility];  Service: General;  Laterality: Right;   TUBAL LIGATION  1981   Social History   Occupational History   Not on file  Tobacco Use   Smoking status: Never   Smokeless tobacco: Never  Substance and Sexual Activity   Alcohol use: No   Drug use: No   Sexual activity: Not Currently

## 2023-09-07 ENCOUNTER — Encounter (HOSPITAL_BASED_OUTPATIENT_CLINIC_OR_DEPARTMENT_OTHER): Payer: Self-pay | Admitting: Cardiology

## 2023-09-07 ENCOUNTER — Ambulatory Visit (HOSPITAL_BASED_OUTPATIENT_CLINIC_OR_DEPARTMENT_OTHER): Payer: Medicare Other | Admitting: Cardiology

## 2023-09-07 ENCOUNTER — Ambulatory Visit (HOSPITAL_BASED_OUTPATIENT_CLINIC_OR_DEPARTMENT_OTHER): Admission: RE | Admit: 2023-09-07 | Payer: Medicare Other | Source: Home / Self Care | Admitting: Orthopedic Surgery

## 2023-09-07 VITALS — BP 144/84 | HR 80 | Ht 63.0 in | Wt 171.0 lb

## 2023-09-07 DIAGNOSIS — Z01818 Encounter for other preprocedural examination: Secondary | ICD-10-CM

## 2023-09-07 DIAGNOSIS — Z01810 Encounter for preprocedural cardiovascular examination: Secondary | ICD-10-CM

## 2023-09-07 DIAGNOSIS — I493 Ventricular premature depolarization: Secondary | ICD-10-CM

## 2023-09-07 DIAGNOSIS — I2510 Atherosclerotic heart disease of native coronary artery without angina pectoris: Secondary | ICD-10-CM | POA: Diagnosis not present

## 2023-09-07 DIAGNOSIS — E7800 Pure hypercholesterolemia, unspecified: Secondary | ICD-10-CM | POA: Diagnosis not present

## 2023-09-07 DIAGNOSIS — I10 Essential (primary) hypertension: Secondary | ICD-10-CM | POA: Diagnosis not present

## 2023-09-07 SURGERY — CARPAL TUNNEL RELEASE
Anesthesia: Regional | Site: Wrist | Laterality: Right

## 2023-09-07 NOTE — Patient Instructions (Signed)
Medication Instructions:  Your physician recommends that you continue on your current medications as directed. Please refer to the Current Medication list given to you today.  *If you need a refill on your cardiac medications before your next appointment, please call your pharmacy*  Follow-Up: At [facility], you and your health needs are our priority.  As part of our continuing mission to provide you with exceptional heart care, we have created designated Provider Care Teams.  These Care Teams include your primary Cardiologist (physician) and Advanced Practice Providers (APPs -  Physician Assistants and Nurse Practitioners) who all work together to provide you with the care you need, when you need it.  We recommend signing up for the patient portal called "MyChart".  Sign up information is provided on this After Visit Summary.  MyChart is used to connect with patients for Virtual Visits (Telemedicine).  Patients are able to view lab/test results, encounter notes, upcoming appointments, etc.  Non-urgent messages can be sent to your provider as well.   To learn more about what you can do with MyChart, go to [email or web address].    Your next appointment:   May of Dr. Cristal Deer

## 2023-09-07 NOTE — Progress Notes (Signed)
Cardiology Office Note:  .    Date:  09/07/2023  ID:  Sheena Trevino, Sheena Trevino 1946-01-17, MRN [id number] PCP: Sheena Hazel, MD  [facility] Providers Cardiologist:  Sheena Red, MD     History of Present Illness: .    Sheena Trevino is a 77 y.o. female with a hx of hypertension, hyperlipidemia, type II diabetes, anxiety who is seen for follow up today. I initially met her 10/03/20 as a new consult at the request of Sheena Hazel, MD for the evaluation and management of chest pain.   CV history: Did not tolerate simvastatin or atorvastatin in the past. Now tolerating low dose rosuvastatin.   In 05/2021, she continued to have intermittent chest discomfort that seemed to be less frequent but still random. Her BP at home was running 130-132/70s. Has remotely had issues with hypotension in the afternoon, none at that visit. This was when she was on more blood pressure medications. She was tolerating rosuvastatin, though still had muscle aches/joint pains. Reviewed lipids/LFTs from 03/2021. Was tolerating aspirin as well.    At her visit 04/2022, she had been diagnosed with PMR in the interim. Her pain had returned but was less severe. It was thought her prednisone was tapered too quickly. She still had episodes of chest pressure, fairly short duration. She was closer to being certain that stress was causing her chest pressure. She continued to notice random palpitations at rest and with activity, typically lasting for seconds at a time. Noted during her EKG and during that visit per the patient.   Today, she reports she is in a great deal of pain secondary to carpal tunnel. She will be undergoing right carpal tunnel release with bier block anesthesia, to be performed by Dr. Dorene Trevino. Originally scheduled for today but was told she needed cardiovascular clearance. She confirms that she is able to walk 1-2 blocks on level ground, climb a flight of stairs, and run a short distance. Able to complete  housework and light yard work.   She affirms high anxiety and stress levels lately. She has Lorazepam to take as needed, but she doesn't need it very often. Her palpitations are improved. She has not been as aware of them lately, although they are still mild occasionally.  About 6 weeks ago she completed her prednisone taper.  She denies any chest pain, shortness of breath, peripheral edema, lightheadedness, headaches, syncope, orthopnea, or PND.  ROS:  Please see the history of present illness. ROS otherwise negative except as noted.  (+) Bilateral hand pain (+) Anxiety/Stress (+) Occasional palpitations  Studies Reviewed: Marland Kitchen           Physical Exam:    VS:  BP (!) 144/84 (BP Location: Left Arm, Patient Position: Sitting, Cuff Size: Large)   Pulse 80   Ht 5\' 3"  (1.6 m)   Wt 171 lb (77.6 kg)   SpO2 97%   BMI 30.29 kg/m    Wt Readings from Last 3 Encounters:  09/07/23 171 lb (77.6 kg)  05/01/22 182 lb 14.4 oz (83 kg)  06/03/21 179 lb (81.2 kg)    GEN: Well nourished, well developed in no acute distress HEENT: Normal, moist mucous membranes NECK: No JVD CARDIAC: regular rhythm, normal S1 and S2, no rubs or gallops. No murmur. VASCULAR: Radial and DP pulses 2+ bilaterally. No carotid bruits RESPIRATORY:  Clear to auscultation without rales, wheezing or rhonchi  ABDOMEN: Soft, non-tender, non-distended MUSCULOSKELETAL:  Ambulates independently SKIN: Warm and dry, no  edema NEUROLOGIC:  Alert and oriented x 3. No focal neuro deficits noted. PSYCHIATRIC:  Normal affect   ASSESSMENT AND PLAN: .    Preoperative cardiovascular evaluation According to the Revised Cardiac Risk Index (RCRI), her Perioperative Risk of Major Cardiac Event is (%): 0.4  Her Functional Capacity in METs is: 9.25 according to the Duke Activity Status Index (DASI).  The patient is not currently having active cardiac symptoms, and they can achieve >4 METs of activity.  According to ACC/AHA Guidelines, no  further testing is needed.  Proceed with surgery at acceptable risk.  Our service is available as needed in the peri-operative period.      Nonobstructive CAD: -CT cardiac and FFR shows mild CAD, but no obstructive lesions -continue aspirin 81 mg daily -tolerating rosuvastatin, gradually ramped to 5 mg daily. Discussed goal LDL <70, but as she has had intolerance to other statins in the past and has mild aches on this dose, will keep at 5 mg daily dose for now. Last LDL 73, reasonable -counseled on Trevino flag warning signs that need immediate medical attention   Hypertension: -blood pressure goal of <130/80, but in pain today -continue HCTZ, losartan   Hyperlipidemia: Lipids 08/31/20(pre statin): Tchol 199, HDL 46, LDL 121, TG 181 Lipids 04/17/21 (5 mg rosuvastatin daily): Tchol 174, HDL 57, LDL 86, TG 182 Lipids 03/25/22: Tchol 179, HDL 59, LDL 73, TG 297 (unknown if fasting) Lipids 03/17/23: Tchol 118, HDL 36, TG 181, LDL 52 (unknown if fasting) -continue rosuvastatin 5 mg daily   Type II diabetes: -last A1c per [facility] 6.2 -BMI 30 -continue aspirin -statin as above -on metformin. Consider SGLT2i or GLP1RA with nonobstructive CAD if additional medication needed in the future.   Cardiac risk counseling and prevention recommendations: -recommend heart healthy/Mediterranean diet, with whole grains, fruits, vegetable, fish, lean meats, nuts, and olive oil. Limit salt. -recommend moderate walking, 3-5 times/week for 30-50 minutes each session. Aim for at least 150 minutes.week. Goal should be pace of 3 miles/hours, or walking 1.5 miles in 30 minutes -recommend avoidance of tobacco products. Avoid excess alcohol.  Dispo: Follow-up as scheduled in May 2025, or sooner as needed.  I,Sheena Trevino,acting as a Neurosurgeon for [employer], MD.,have documented all relevant documentation on the behalf of Sheena Red, MD,as directed by  Sheena Red, MD while in the presence of  Sheena Red, MD.  I, Sheena Red, MD, have reviewed all documentation for this visit. The documentation on 09/07/23 for the exam, diagnosis, procedures, and orders are all accurate and complete.   Signed, Sheena Red, MD

## 2023-09-08 ENCOUNTER — Telehealth: Payer: Self-pay | Admitting: Orthopedic Surgery

## 2023-09-08 NOTE — Telephone Encounter (Signed)
Patient has not been rescheduled for right carpal tunnel release since she received her clearance.  She would like a Monday or Tuesday surgery date. If time becomes available at [facility], [facility] or [facility] the surgery could be done the morning of 09-21-23.  Patient states she is in a tremendous amount of pain with her right wrist and hand and would like something stronger than the current dosage of Gabapentin 100mg .

## 2023-09-09 MED ORDER — GABAPENTIN 300 MG PO CAPS: 300.0000 mg | ORAL_CAPSULE | Freq: Three times a day (TID) | ORAL | 0 refills | Status: DC

## 2023-09-09 NOTE — Addendum Note (Signed)
Addended by: Barbette Or on: 09/09/2023 10:15 AM   Modules accepted: Orders

## 2023-09-09 NOTE — Telephone Encounter (Signed)
I would try gabapentin 300 mg 3 times daily

## 2023-09-09 NOTE — Telephone Encounter (Signed)
I talked to the pt. She wants to try this. She stated she is almost out of the gabapentin. New rx for the 300mg  was sent in for pt

## 2023-09-10 ENCOUNTER — Telehealth: Payer: Medicare Other

## 2023-09-15 DIAGNOSIS — L821 Other seborrheic keratosis: Secondary | ICD-10-CM | POA: Diagnosis not present

## 2023-09-15 DIAGNOSIS — L814 Other melanin hyperpigmentation: Secondary | ICD-10-CM | POA: Diagnosis not present

## 2023-09-15 DIAGNOSIS — L570 Actinic keratosis: Secondary | ICD-10-CM | POA: Diagnosis not present

## 2023-09-15 DIAGNOSIS — D2239 Melanocytic nevi of other parts of face: Secondary | ICD-10-CM | POA: Diagnosis not present

## 2023-09-15 DIAGNOSIS — D1801 Hemangioma of skin and subcutaneous tissue: Secondary | ICD-10-CM | POA: Diagnosis not present

## 2023-09-21 ENCOUNTER — Ambulatory Visit (HOSPITAL_BASED_OUTPATIENT_CLINIC_OR_DEPARTMENT_OTHER): Admit: 2023-09-21 | Payer: Medicare Other | Admitting: Orthopedic Surgery

## 2023-09-21 ENCOUNTER — Encounter (HOSPITAL_BASED_OUTPATIENT_CLINIC_OR_DEPARTMENT_OTHER): Payer: Self-pay

## 2023-09-21 ENCOUNTER — Encounter: Payer: Medicare Other | Admitting: Surgical

## 2023-09-21 ENCOUNTER — Other Ambulatory Visit: Payer: Self-pay | Admitting: Surgical

## 2023-09-21 ENCOUNTER — Telehealth: Payer: Self-pay | Admitting: Orthopedic Surgery

## 2023-09-21 DIAGNOSIS — G5601 Carpal tunnel syndrome, right upper limb: Secondary | ICD-10-CM | POA: Diagnosis not present

## 2023-09-21 SURGERY — CARPAL TUNNEL RELEASE
Anesthesia: Regional | Site: Wrist | Laterality: Right

## 2023-09-21 MED ORDER — HYDROCODONE-ACETAMINOPHEN 5-325 MG PO TABS: 1.0000 | ORAL_TABLET | Freq: Four times a day (QID) | ORAL | 0 refills | Status: DC | PRN

## 2023-09-21 NOTE — Telephone Encounter (Signed)
Called and advised.

## 2023-09-21 NOTE — Telephone Encounter (Signed)
Patient's husband called. Would like something else for her for pain. His call back number is [phone]

## 2023-09-22 ENCOUNTER — Ambulatory Visit (HOSPITAL_BASED_OUTPATIENT_CLINIC_OR_DEPARTMENT_OTHER): Payer: Medicare Other | Admitting: Cardiology

## 2023-09-30 ENCOUNTER — Ambulatory Visit: Payer: Medicare Other | Admitting: Surgical

## 2023-09-30 ENCOUNTER — Encounter: Payer: Self-pay | Admitting: Surgical

## 2023-09-30 DIAGNOSIS — R202 Paresthesia of skin: Secondary | ICD-10-CM

## 2023-09-30 NOTE — Progress Notes (Signed)
Post-Op Visit Note   Patient: Sheena Trevino           Date of Birth: 03-06-1946           MRN: [id number] Visit Date: 09/30/2023 PCP: Sigmund Hazel, MD   Assessment & Plan:  Chief Complaint:  Chief Complaint  Patient presents with   Right Wrist - Routine Post Op    RIGHT CTR (surgery date 09-21-23)   Visit Diagnoses:  1. Paresthesia of skin     Plan: Patient is a 77 year old female who presents s/p right carpal tunnel release on 09/21/2023.  She is 9 days postop.  She reports 90% improvement of symptoms compared with preoperatively.  She is feeling a whole lot better.  No fevers or chills.  No drainage from the incision.  She has avoided submersion.  She has a little bit of numbness at the tips of her thumb, index, long fingers (mostly in the thumb).  Aside from this she is feeling great.  Did have a lot of itching with hydrocodone and this was added to her medication list today.  On exam, patient has intact EPL, FPL, finger abduction, grip strength testing.  Palpable radial pulse of the operative extremity.  She has incision that is healing well without evidence of infection or dehiscence.  Sutures intact.  These are going to be left in until her next appointment on Monday and then they will be removed.  Follow-up in 5 days for suture removal.  Follow-Up Instructions: No follow-ups on file.   Orders:  No orders of the defined types were placed in this encounter.  No orders of the defined types were placed in this encounter.   Imaging: No results found.  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   Past Medical History:  Diagnosis Date   Anemia    in late twenties   Anxiety    Arthritis    "knees, fingers" (09/05/2014)   Diabetes mellitus without complication (HCC)    GERD  (gastroesophageal reflux disease)    H/O hiatal hernia    Hyperlipidemia    Hypertension    Insomnia    Irritable bowel    with stress   Obesity    PONV (postoperative nausea and vomiting)     Family History  Problem Relation Age of Onset   Cancer Mother        lung   Heart disease Father    Heart attack Father    Diabetes Sister    Leukemia Brother    Heart attack Paternal Grandfather    Breast cancer Neg Hx     Past Surgical History:  Procedure Laterality Date   ABDOMINAL HYSTERECTOMY  1982   partial   BREAST BIOPSY Left    COLONOSCOPY     FOOT SURGERY Right ~ 2004   "top of my foot"   SHOULDER ARTHROSCOPY WITH OPEN ROTATOR CUFF REPAIR Right ~ 2000   THYROID LOBECTOMY Right 09/05/2014   THYROID LOBECTOMY Right 09/05/2014   Procedure: RIGHT THYROID LOBECTOMY;  Surgeon: Darnell Level, MD;  Location: [facility] OR;  Service: General;  Laterality: Right;   TUBAL LIGATION  1981   Social History   Occupational History   Not on file  Tobacco Use   Smoking status: Never   Smokeless tobacco:  Never  Substance and Sexual Activity   Alcohol use: No   Drug use: No   Sexual activity: Not Currently

## 2023-10-01 DIAGNOSIS — E890 Postprocedural hypothyroidism: Secondary | ICD-10-CM | POA: Diagnosis not present

## 2023-10-01 DIAGNOSIS — I10 Essential (primary) hypertension: Secondary | ICD-10-CM | POA: Diagnosis not present

## 2023-10-01 DIAGNOSIS — Z0000 Encounter for general adult medical examination without abnormal findings: Secondary | ICD-10-CM | POA: Diagnosis not present

## 2023-10-01 DIAGNOSIS — Z6829 Body mass index (BMI) 29.0-29.9, adult: Secondary | ICD-10-CM | POA: Diagnosis not present

## 2023-10-01 DIAGNOSIS — Z1331 Encounter for screening for depression: Secondary | ICD-10-CM | POA: Diagnosis not present

## 2023-10-01 DIAGNOSIS — E119 Type 2 diabetes mellitus without complications: Secondary | ICD-10-CM | POA: Diagnosis not present

## 2023-10-01 DIAGNOSIS — G4700 Insomnia, unspecified: Secondary | ICD-10-CM | POA: Diagnosis not present

## 2023-10-05 ENCOUNTER — Ambulatory Visit (INDEPENDENT_AMBULATORY_CARE_PROVIDER_SITE_OTHER): Payer: Medicare Other | Admitting: Surgical

## 2023-10-05 DIAGNOSIS — G5601 Carpal tunnel syndrome, right upper limb: Secondary | ICD-10-CM

## 2023-10-06 ENCOUNTER — Encounter: Payer: Self-pay | Admitting: Surgical

## 2023-10-06 DIAGNOSIS — M353 Polymyalgia rheumatica: Secondary | ICD-10-CM | POA: Diagnosis not present

## 2023-10-06 DIAGNOSIS — E663 Overweight: Secondary | ICD-10-CM | POA: Diagnosis not present

## 2023-10-06 DIAGNOSIS — M1991 Primary osteoarthritis, unspecified site: Secondary | ICD-10-CM | POA: Diagnosis not present

## 2023-10-06 DIAGNOSIS — Z6829 Body mass index (BMI) 29.0-29.9, adult: Secondary | ICD-10-CM | POA: Diagnosis not present

## 2023-10-06 DIAGNOSIS — M25512 Pain in left shoulder: Secondary | ICD-10-CM | POA: Diagnosis not present

## 2023-10-06 DIAGNOSIS — R7989 Other specified abnormal findings of blood chemistry: Secondary | ICD-10-CM | POA: Diagnosis not present

## 2023-10-06 DIAGNOSIS — M25511 Pain in right shoulder: Secondary | ICD-10-CM | POA: Diagnosis not present

## 2023-10-06 NOTE — Progress Notes (Signed)
Post-Op Visit Note   Patient: Sheena Trevino           Date of Birth: [birth year]           MRN: [id number] Visit Date: 10/05/2023 PCP: Sigmund Hazel, MD   Assessment & Plan:  Chief Complaint:  Chief Complaint  Patient presents with   Other    Recheck incision s/p CTR   Visit Diagnoses:  1. Carpal tunnel syndrome, right upper limb     Plan: Patient is a 77 year old female who presents s/p right carpal tunnel release on 09/21/2023.  Doing well overall.  Pain is significantly improved compared with preop.  She is here today for suture removal.  Sutures removed every other suture and there is a little bit of superficial gapping of the incision where the sutures are removed to the point that the remaining sutures were left intact and Steri-Strips were applied on the other areas.  We will see her back in about 4 days for the other sutures to come out.  At that point she can see Dr. August Saucer and ensure that he has no concerns but overall she is not having any pain.  The incision looks good and there is no sign of infection.  Intact EPL, FPL, finger abduction on exam today.  2+ radial pulse.  No evidence of cellulitis or expressible drainage.  Follow-Up Instructions: Return in about 4 days (around 10/09/2023).   Orders:  No orders of the defined types were placed in this encounter.  No orders of the defined types were placed in this encounter.   Imaging: No results found.  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   Past Medical History:  Diagnosis Date   Anemia    in late twenties   Anxiety    Arthritis    "knees, fingers" (09/05/2014)   Diabetes mellitus without complication (HCC)    GERD (gastroesophageal reflux disease)    H/O hiatal hernia    Hyperlipidemia    Hypertension     Insomnia    Irritable bowel    with stress   Obesity    PONV (postoperative nausea and vomiting)     Family History  Problem Relation Age of Onset   Cancer Mother        lung   Heart disease Father    Heart attack Father    Diabetes Sister    Leukemia Brother    Heart attack Paternal Grandfather    Breast cancer Neg Hx     Past Surgical History:  Procedure Laterality Date   ABDOMINAL HYSTERECTOMY  1982   partial   BREAST BIOPSY Left    COLONOSCOPY     FOOT SURGERY Right ~ 2004   "top of my foot"   SHOULDER ARTHROSCOPY WITH OPEN ROTATOR CUFF REPAIR Right ~ 2000   THYROID LOBECTOMY Right 09/05/2014   THYROID LOBECTOMY Right 09/05/2014   Procedure: RIGHT THYROID LOBECTOMY;  Surgeon: Darnell Level, MD;  Location: [facility] OR;  Service: General;  Laterality: Right;   TUBAL LIGATION  1981   Social History   Occupational History   Not on file  Tobacco Use   Smoking status: Never   Smokeless tobacco: Never  Substance and Sexual Activity   Alcohol use: No   Drug use: No  Sexual activity: Not Currently

## 2023-10-09 ENCOUNTER — Ambulatory Visit (INDEPENDENT_AMBULATORY_CARE_PROVIDER_SITE_OTHER): Payer: Medicare Other | Admitting: Orthopedic Surgery

## 2023-10-09 DIAGNOSIS — G5601 Carpal tunnel syndrome, right upper limb: Secondary | ICD-10-CM

## 2023-10-10 ENCOUNTER — Encounter: Payer: Self-pay | Admitting: Orthopedic Surgery

## 2023-10-10 NOTE — Progress Notes (Signed)
Post-Op Visit Note   Patient: Sheena Trevino           Date of Birth: 04-23-1946           MRN: [id number] Visit Date: 10/09/2023 PCP: Sigmund Hazel, MD   Assessment & Plan:  Chief Complaint:  Chief Complaint  Patient presents with   Right Wrist - Routine Post Op    right carpal tunnel release on 09/21/2023--recheck incision for possible suture removal   Visit Diagnoses:  1. Carpal tunnel syndrome, right upper limb     Plan: Sheena Trevino is a patient who is now about 2 weeks out right carpal tunnel release.  She has been doing well.  On examination all sutures removed today.  Still has a little numbness in her fingertips but her debilitating forearm pain has improved.  She will follow-up in 6 weeks for final check.  Follow-Up Instructions: No follow-ups on file.   Orders:  No orders of the defined types were placed in this encounter.  No orders of the defined types were placed in this encounter.   Imaging: No results found.  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   Past Medical History:  Diagnosis Date   Anemia    in late twenties   Anxiety    Arthritis    "knees, fingers" (09/05/2014)   Diabetes mellitus without complication (HCC)    GERD (gastroesophageal reflux disease)    H/O hiatal hernia    Hyperlipidemia    Hypertension    Insomnia    Irritable bowel    with stress   Obesity    PONV (postoperative nausea and vomiting)     Family History  Problem Relation Age of Onset   Cancer Mother        lung   Heart disease Father    Heart attack Father    Diabetes Sister    Leukemia Brother    Heart attack Paternal Grandfather    Breast cancer Neg Hx     Past Surgical History:  Procedure Laterality Date   ABDOMINAL HYSTERECTOMY  1982   partial   BREAST BIOPSY  Left    COLONOSCOPY     FOOT SURGERY Right ~ 2004   "top of my foot"   SHOULDER ARTHROSCOPY WITH OPEN ROTATOR CUFF REPAIR Right ~ 2000   THYROID LOBECTOMY Right 09/05/2014   THYROID LOBECTOMY Right 09/05/2014   Procedure: RIGHT THYROID LOBECTOMY;  Surgeon: Darnell Level, MD;  Location: [facility] OR;  Service: General;  Laterality: Right;   TUBAL LIGATION  1981   Social History   Occupational History   Not on file  Tobacco Use   Smoking status: Never   Smokeless tobacco: Never  Substance and Sexual Activity   Alcohol use: No   Drug use: No   Sexual activity: Not Currently

## 2023-10-12 ENCOUNTER — Telehealth: Payer: Self-pay

## 2023-10-12 NOTE — Telephone Encounter (Signed)
Patient called wanting to know if she could apply a topical cream on her incision?  Stated that the steri strips came off and that the incision is flaky and dry.  Cb# [phone].  Please advise.  Thank you.

## 2023-10-13 NOTE — Telephone Encounter (Signed)
I'd give it a week and then okay to use some moisterizer

## 2023-10-13 NOTE — Telephone Encounter (Signed)
LMOM for patient of the below message from [facility]

## 2023-11-09 DIAGNOSIS — H2512 Age-related nuclear cataract, left eye: Secondary | ICD-10-CM | POA: Diagnosis not present

## 2023-11-09 DIAGNOSIS — H52209 Unspecified astigmatism, unspecified eye: Secondary | ICD-10-CM | POA: Diagnosis not present

## 2023-11-10 DIAGNOSIS — H2511 Age-related nuclear cataract, right eye: Secondary | ICD-10-CM | POA: Diagnosis not present

## 2023-11-20 ENCOUNTER — Ambulatory Visit (INDEPENDENT_AMBULATORY_CARE_PROVIDER_SITE_OTHER): Payer: Medicare Other | Admitting: Orthopedic Surgery

## 2023-11-20 ENCOUNTER — Encounter: Payer: Self-pay | Admitting: Orthopedic Surgery

## 2023-11-20 DIAGNOSIS — G5601 Carpal tunnel syndrome, right upper limb: Secondary | ICD-10-CM

## 2023-11-20 NOTE — Progress Notes (Unsigned)
   Post-Op Visit Note   Patient: Sheena Trevino           Date of Birth: [birth year]           MRN: [id number] Visit Date: 11/20/2023 PCP: Sigmund Hazel, MD   Assessment & Plan:  Chief Complaint:  Chief Complaint  Patient presents with   Right Wrist - Routine Post Op, Follow-up   Visit Diagnoses:  1. Carpal tunnel syndrome, right upper limb     Plan: Patient underwent right carpal tunnel release 2 months ago.  Still having some numbness in the fingertips digits 1 2 and 3 but no pain.  She is right-hand dominant.  Overall doing well.  She is planning on getting the left carpal tunnel release done sometime in the future but she has cataract surgery in 2 weeks.  She is using a splint on the left-hand side.  Incision intact on the right grip strength is still a little bit decreased.  I think the numbness will likely resolve completely within 2 to 4 months.  Will see her back as needed.  Follow-Up Instructions: No follow-ups on file.   Orders:  No orders of the defined types were placed in this encounter.  No orders of the defined types were placed in this encounter.   Imaging: No results found.  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   Past Medical History:  Diagnosis Date   Anemia    in late twenties   Anxiety    Arthritis    "knees, fingers" (09/05/2014)   Diabetes mellitus without complication (HCC)    GERD (gastroesophageal reflux disease)    H/O hiatal hernia    Hyperlipidemia    Hypertension    Insomnia    Irritable bowel    with stress   Obesity    PONV (postoperative nausea and vomiting)     Family History  Problem Relation Age of Onset   Cancer Mother        lung   Heart disease Father    Heart attack Father    Diabetes Sister    Leukemia Brother     Heart attack Paternal Grandfather    Breast cancer Neg Hx     Past Surgical History:  Procedure Laterality Date   ABDOMINAL HYSTERECTOMY  1982   partial   BREAST BIOPSY Left    COLONOSCOPY     FOOT SURGERY Right ~ 2004   "top of my foot"   SHOULDER ARTHROSCOPY WITH OPEN ROTATOR CUFF REPAIR Right ~ 2000   THYROID LOBECTOMY Right 09/05/2014   THYROID LOBECTOMY Right 09/05/2014   Procedure: RIGHT THYROID LOBECTOMY;  Surgeon: Darnell Level, MD;  Location: [facility] OR;  Service: General;  Laterality: Right;   TUBAL LIGATION  1981   Social History   Occupational History   Not on file  Tobacco Use   Smoking status: Never   Smokeless tobacco: Never  Substance and Sexual Activity   Alcohol use: No   Drug use: No   Sexual activity: Not Currently

## 2023-11-30 DIAGNOSIS — H25011 Cortical age-related cataract, right eye: Secondary | ICD-10-CM | POA: Diagnosis not present

## 2023-11-30 DIAGNOSIS — H2511 Age-related nuclear cataract, right eye: Secondary | ICD-10-CM | POA: Diagnosis not present

## 2023-11-30 DIAGNOSIS — H52209 Unspecified astigmatism, unspecified eye: Secondary | ICD-10-CM | POA: Diagnosis not present

## 2023-11-30 DIAGNOSIS — H25041 Posterior subcapsular polar age-related cataract, right eye: Secondary | ICD-10-CM | POA: Diagnosis not present

## 2024-01-19 ENCOUNTER — Encounter: Payer: Self-pay | Admitting: Physical Medicine and Rehabilitation

## 2024-01-19 ENCOUNTER — Ambulatory Visit (INDEPENDENT_AMBULATORY_CARE_PROVIDER_SITE_OTHER): Payer: Medicare Other | Admitting: Physical Medicine and Rehabilitation

## 2024-01-19 DIAGNOSIS — M797 Fibromyalgia: Secondary | ICD-10-CM | POA: Diagnosis not present

## 2024-01-19 DIAGNOSIS — G8929 Other chronic pain: Secondary | ICD-10-CM

## 2024-01-19 DIAGNOSIS — M5441 Lumbago with sciatica, right side: Secondary | ICD-10-CM

## 2024-01-19 DIAGNOSIS — M7918 Myalgia, other site: Secondary | ICD-10-CM

## 2024-01-19 DIAGNOSIS — M5416 Radiculopathy, lumbar region: Secondary | ICD-10-CM

## 2024-01-19 MED ORDER — TRIAZOLAM 0.25 MG PO TABS: ORAL_TABLET | ORAL | 0 refills | Status: AC

## 2024-01-19 NOTE — Progress Notes (Signed)
Sheena Trevino - 78 y.o. female MRN [id number]  Date of birth: Oct 03, 1946  Office Visit Note: Visit Date: 01/19/2024 PCP: Sigmund Hazel, MD Referred by: Sigmund Hazel, MD  Subjective: Chief Complaint  Patient presents with   Lower Back - Pain   HPI: Sheena Trevino is a 78 y.o. female who comes in today as a self referral for evaluation of chronic, worsening and severe bilateral lower back pain radiating to buttocks, intermittent pain radiating down right lateral leg to knee. She reports episodes of pain over the years that typically resolves with time. Her pain worsened about 2 weeks ago and becomes worse with movement and activity. Severe pain noted with prolonged walking. She describes her pain as sore and aching sensation, currently rates as 6 out of 10. Some relief of pain with home exercise regimen, rest and use of medications. No history of formal physical therapy. Lumbar radiographs from 2021 exhibits rightward curvature, multi level degenerative changes, lower lumbar facet arthropathy noted. No spondylolisthesis. No history of lumbar surgery/injections. Patient denies focal weakness, numbness and tingling. No recent trauma or falls   Patients course is complicated by fibromyalgia and polymyalgia rheumatica. She is currently treated at [facility].      Review of Systems  Musculoskeletal:  Positive for back pain.  Neurological:  Negative for tingling, sensory change, focal weakness and weakness.  All other systems reviewed and are negative.  Otherwise per HPI.  Assessment & Plan: Visit Diagnoses:    ICD-10-CM   1. Chronic bilateral low back pain with right-sided sciatica  M54.41    G89.29     2. Lumbar radiculopathy  M54.16     3. Fibromyalgia  M79.7     4. Myofascial pain syndrome  M79.18        Plan: Findings:  Chronic, worsening and severe bilateral lower back pain radiating to buttocks, intermittent pain radiating down right lateral leg to knee. Patient  continues to have severe pain despite good conservative therapies such as home exercise regimen, rest and use of medications. Patients clinical presentation and exam are consistent with L5 nerve pattern. I also feel her fibromyalgia could be working to exacerbate her symptoms. We discussed treatment plan in detail today, next step is to place order for lumbar MRI imaging. Patient voiced issues with anxiety and claustrophobia. I prescribed pre-procedure sedation with Halcion for her to take on day of imaging. I placed lumbar MRI imaging order for [facility] as this facility has open MRI machine. Depending on results of MRI imaging we discussed possibility of performing lumbar injections. Patient has no questions at this time. I will see her back for lumbar MRI review. She can continue with current medication regimen and remain active as tolerated. No red flag symptoms noted upon exam today.     Meds & Orders: No orders of the defined types were placed in this encounter.  No orders of the defined types were placed in this encounter.   Follow-up: Return for Lumbar MRI review.   Procedures: No procedures performed      Clinical History: No specialty comments available.   She reports that she has never smoked. She has never used smokeless tobacco. No results for input(s): "HGBA1C", "LABURIC" in the last 8760 hours.  Objective:  VS:  HT:    WT:   BMI:     BP:   HR: bpm  TEMP: ( )  RESP:  Physical Exam Vitals and nursing note reviewed.  HENT:  Head: Normocephalic and atraumatic.     Right Ear: External ear normal.     Left Ear: External ear normal.     Nose: Nose normal.     Mouth/Throat:     Mouth: Mucous membranes are moist.  Eyes:     Extraocular Movements: Extraocular movements intact.  Cardiovascular:     Rate and Rhythm: Normal rate.     Pulses: Normal pulses.  Pulmonary:     Effort: Pulmonary effort is normal.  Abdominal:     General: Abdomen is flat. There is no  distension.  Musculoskeletal:        General: Tenderness present.     Cervical back: Normal range of motion.     Comments: Patient rises from seated position to standing without difficulty. Good lumbar range of motion. No pain noted with facet loading. 5/5 strength noted with bilateral hip flexion, knee flexion/extension, ankle dorsiflexion/plantarflexion and EHL. No clonus noted bilaterally. No pain upon palpation of greater trochanters. No pain with internal/external rotation of bilateral hips. Sensation intact bilaterally. Dysesthesias noted to right L5 dermatome. Myofascial tenderness noted to bilateral gluteal regions. Negative slump test bilaterally. Ambulates without aid, gait steady.     Skin:    General: Skin is warm and dry.     Capillary Refill: Capillary refill takes less than 2 seconds.  Neurological:     General: No focal deficit present.     Mental Status: She is alert and oriented to person, place, and time.  Psychiatric:        Mood and Affect: Mood normal.        Behavior: Behavior normal.     Ortho Exam  Imaging: No results found.  Past Medical/Family/Surgical/Social History: Medications & Allergies reviewed per EMR, new medications updated. 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   Past Medical History:  Diagnosis Date   Anemia    in late twenties   Anxiety    Arthritis    "knees, fingers" (09/05/2014)   Diabetes mellitus without complication (HCC)    GERD (gastroesophageal reflux disease)    H/O hiatal hernia    Hyperlipidemia    Hypertension    Insomnia    Irritable bowel    with stress   Obesity    PONV (postoperative nausea and vomiting)    Family History  Problem Relation Age of Onset   Cancer Mother        lung   Heart disease Father    Heart attack Father     Diabetes Sister    Leukemia Brother    Heart attack Paternal Grandfather    Breast cancer Neg Hx    Past Surgical History:  Procedure Laterality Date   ABDOMINAL HYSTERECTOMY  1982   partial   BREAST BIOPSY Left    COLONOSCOPY     FOOT SURGERY Right ~ 2004   "top of my foot"   SHOULDER ARTHROSCOPY WITH OPEN ROTATOR CUFF REPAIR Right ~ 2000   THYROID LOBECTOMY Right 09/05/2014   THYROID LOBECTOMY Right 09/05/2014   Procedure: RIGHT THYROID LOBECTOMY;  Surgeon: Darnell Level, MD;  Location: [facility] OR;  Service: General;  Laterality: Right;   TUBAL LIGATION  1981   Social History   Occupational History   Not on file  Tobacco Use   Smoking status: Never  Smokeless tobacco: Never  Substance and Sexual Activity   Alcohol use: No   Drug use: No   Sexual activity: Not Currently

## 2024-01-19 NOTE — Progress Notes (Signed)
Lower back pain. 2 weeks of pain.  She uses Voltaren gel and Advil as needed. She stated it isn't going anywhere, both side. No injury that she can recall.  R>L  Pain goes down R leg at times

## 2024-01-29 DIAGNOSIS — Z6830 Body mass index (BMI) 30.0-30.9, adult: Secondary | ICD-10-CM | POA: Diagnosis not present

## 2024-01-29 DIAGNOSIS — J069 Acute upper respiratory infection, unspecified: Secondary | ICD-10-CM | POA: Diagnosis not present

## 2024-02-15 DIAGNOSIS — R051 Acute cough: Secondary | ICD-10-CM | POA: Diagnosis not present

## 2024-02-22 DIAGNOSIS — M47816 Spondylosis without myelopathy or radiculopathy, lumbar region: Secondary | ICD-10-CM | POA: Diagnosis not present

## 2024-02-22 DIAGNOSIS — M47817 Spondylosis without myelopathy or radiculopathy, lumbosacral region: Secondary | ICD-10-CM | POA: Diagnosis not present

## 2024-03-03 ENCOUNTER — Encounter: Payer: Self-pay | Admitting: Physical Medicine and Rehabilitation

## 2024-03-03 ENCOUNTER — Ambulatory Visit: Payer: Medicare Other | Admitting: Physical Medicine and Rehabilitation

## 2024-03-03 VITALS — BP 178/103 | HR 87

## 2024-03-03 DIAGNOSIS — M47816 Spondylosis without myelopathy or radiculopathy, lumbar region: Secondary | ICD-10-CM

## 2024-03-03 DIAGNOSIS — G8929 Other chronic pain: Secondary | ICD-10-CM | POA: Diagnosis not present

## 2024-03-03 DIAGNOSIS — M545 Low back pain, unspecified: Secondary | ICD-10-CM | POA: Diagnosis not present

## 2024-03-03 DIAGNOSIS — M797 Fibromyalgia: Secondary | ICD-10-CM | POA: Diagnosis not present

## 2024-03-03 NOTE — Progress Notes (Signed)
 Sheena Trevino - 78 y.o. female MRN [id number]  Date of birth: Feb 01, 1946  Office Visit Note: Visit Date: 03/03/2024 PCP: Sigmund Hazel, MD Referred by: Sigmund Hazel, MD  Subjective: Chief Complaint  Patient presents with   Lower Back - Pain   HPI: Sheena Trevino is a 78 y.o. female who comes in today for evaluation of chronic left sided lower back pain, intermittent radiation to buttock and down left leg. She reports episodes of pain over the years that are typically self limiting. Her pain worsens with walking and standing, describes as sore and aching sensation, currently rates as 5 out of 10. Some relief of pain with home exercise regimen, rest and use of medications. Recent lumbar MRI imaging exhibits multi level mild to moderate degenerative changes. Moderate facet arthropathy noted at L3-L4. There is a facet joint effusion on the left at this level. No high grade spinal canal stenosis noted. Patient denies focal weakness, numbness and tingling. No recent trauma or falls    Patients course is complicated by fibromyalgia and polymyalgia rheumatica. She is currently treated at [facility].         Review of Systems  Musculoskeletal:  Positive for back pain and myalgias.  Neurological:  Negative for tingling, sensory change, focal weakness and weakness.  All other systems reviewed and are negative.  Otherwise per HPI.  Assessment & Plan: Visit Diagnoses:    ICD-10-CM   1. Chronic bilateral low back pain without sciatica  M54.50 Ambulatory referral to Physical Therapy   G89.29     2. Facet arthropathy, lumbar  M47.816 Ambulatory referral to Physical Therapy    3. Fibromyalgia  M79.7 Ambulatory referral to Physical Therapy       Plan: Findings:  Chronic left sided lower back pain, intermittent referral of pain to left buttock and down leg. Patient continues to have severe pain despite good conservative therapies such as home exercise regimen, rest and use of  medications.  Patient's clinical presentation and exam are consistent with facet mediated pain. I discussed recent lumbar MRI with her today using spine model.  There is moderate facet joint arthropathy at the level of L3-L4, facet joint effusion noted on the left at this level.  Intermittent pain radiating to left buttock and down left leg does seem more like a facet joint syndrome.  We discussed treatment plan in detail today, would recommend short course of formal physical therapy with a focus on core strengthening and posture.  I also discussed possibility of performing facet joint injection on the left at L3-L4. She would like to hold on injection at this time. I also encouraged her to talk with rheumatology about continued treatment/medication management of her fibromyalgia. I encouraged her to follow up with [place] as needed. We are happy to try facet joint injection should her pain become more constant. No red flag symptoms noted upon exam today.     Meds & Orders: No orders of the defined types were placed in this encounter.   Orders Placed This Encounter  Procedures   Ambulatory referral to Physical Therapy    Follow-up: Return if symptoms worsen or fail to improve.   Procedures: No procedures performed      Clinical History: Lumbar MRI Imaging 02/24/2024 [facility]) -Report under media tab   She reports that she has never smoked. She has never used smokeless tobacco. No results for input(s): "HGBA1C", "LABURIC" in the last 8760 hours.  Objective:  VS:  HT:  WT:   BMI:     BP:(!) 178/103  HR:87bpm  TEMP: ( )  RESP:  Physical Exam Vitals and nursing note reviewed.  HENT:     Head: Normocephalic and atraumatic.     Right Ear: External ear normal.     Left Ear: External ear normal.     Nose: Nose normal.     Mouth/Throat:     Mouth: Mucous membranes are moist.  Eyes:     Extraocular Movements: Extraocular movements intact.  Cardiovascular:     Rate and Rhythm: Normal  rate.     Pulses: Normal pulses.  Pulmonary:     Effort: Pulmonary effort is normal.  Abdominal:     General: Abdomen is flat. There is no distension.  Musculoskeletal:        General: Tenderness present.     Cervical back: Normal range of motion.     Comments: Patient rises from seated position to standing without difficulty. Pain noted with facet loading and lumbar extension. 5/5 strength noted with bilateral hip flexion, knee flexion/extension, ankle dorsiflexion/plantarflexion and EHL. No clonus noted bilaterally. No pain upon palpation of greater trochanters. No pain with internal/external rotation of bilateral hips. Sensation intact bilaterally. Negative slump test bilaterally. Ambulates without aid, gait steady.     Skin:    General: Skin is warm and dry.     Capillary Refill: Capillary refill takes less than 2 seconds.  Neurological:     General: No focal deficit present.     Mental Status: She is alert and oriented to person, place, and time.  Psychiatric:        Mood and Affect: Mood normal.        Behavior: Behavior normal.     Ortho Exam  Imaging: No results found.  Past Medical/Family/Surgical/Social History: Medications & Allergies reviewed per EMR, new medications updated. 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   Past Medical History:  Diagnosis Date   Anemia    in late twenties   Anxiety    Arthritis    "knees, fingers" (09/05/2014)   Diabetes mellitus without complication (HCC)    GERD (gastroesophageal reflux disease)    H/O hiatal hernia    Hyperlipidemia    Hypertension    Insomnia    Irritable bowel    with stress   Obesity    PONV (postoperative nausea and vomiting)    Family History  Problem Relation Age of Onset   Cancer Mother        lung   Heart  disease Father    Heart attack Father    Diabetes Sister    Leukemia Brother    Heart attack Paternal Grandfather    Breast cancer Neg Hx    Past Surgical History:  Procedure Laterality Date   ABDOMINAL HYSTERECTOMY  1982   partial   BREAST BIOPSY Left    COLONOSCOPY     FOOT SURGERY Right ~ 2004   "top of my foot"   SHOULDER ARTHROSCOPY WITH OPEN ROTATOR CUFF REPAIR Right ~ 2000   THYROID LOBECTOMY Right 09/05/2014   THYROID LOBECTOMY Right 09/05/2014   Procedure: RIGHT THYROID LOBECTOMY;  Surgeon: Darnell Level, MD;  Location: [facility] OR;  Service: General;  Laterality: Right;   TUBAL LIGATION  1981   Social History  Occupational History   Not on file  Tobacco Use   Smoking status: Never   Smokeless tobacco: Never  Substance and Sexual Activity   Alcohol use: No   Drug use: No   Sexual activity: Not Currently

## 2024-03-03 NOTE — Progress Notes (Signed)
 Pain Scale   Average Pain 5

## 2024-03-10 NOTE — Therapy (Incomplete)
 OUTPATIENT PHYSICAL THERAPY THORACOLUMBAR EVALUATION   Patient Name: Sheena Trevino MRN: [id number] DOB:31-Mar-1946, 78 y.o., female Today's Date: 03/10/2024  END OF SESSION:   Past Medical History:  Diagnosis Date   Anemia    in late twenties   Anxiety    Arthritis    "knees, fingers" (09/05/2014)   Diabetes mellitus without complication (HCC)    GERD (gastroesophageal reflux disease)    H/O hiatal hernia    Hyperlipidemia    Hypertension    Insomnia    Irritable bowel    with stress   Obesity    PONV (postoperative nausea and vomiting)    Past Surgical History:  Procedure Laterality Date   ABDOMINAL HYSTERECTOMY  1982   partial   BREAST BIOPSY Left    COLONOSCOPY     FOOT SURGERY Right ~ 2004   "top of my foot"   SHOULDER ARTHROSCOPY WITH OPEN ROTATOR CUFF REPAIR Right ~ 2000   THYROID LOBECTOMY Right 09/05/2014   THYROID LOBECTOMY Right 09/05/2014   Procedure: RIGHT THYROID LOBECTOMY;  Surgeon: Darnell Level, MD;  Location: [facility] OR;  Service: General;  Laterality: Right;   TUBAL LIGATION  1981   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  ONSET DATE: chronic  SUBJECTIVE:                                                                                                                                                                                           SUBJECTIVE STATEMENT: Chronic lower back pain, left greater than right   PERTINENT  HISTORY:  Fibromyalgia, polymyalgia rheumatica, DM, GERD, HTN  PAIN:  NPRS scale: ***/10 Pain location: *** Pain description: *** Aggravating factors: *** Relieving factors: ***  PRECAUTIONS: None  WEIGHT BEARING RESTRICTIONS: No  FALLS:  Has patient fallen in last 6 months? {fallsyesno:27318}  LIVING ENVIRONMENT: Lives with: {[facility] lives with:25569::"lives with their family"} Lives in: {Lives in:25570} Stairs: {opstairs:27293} Has following equipment at home: {Assistive devices:23999}  OCCUPATION: ***  PLOF: Independent  PATIENT GOALS: ***  Next MD Visit: none scheduled at time of eval   OBJECTIVE:  DIAGNOSTIC FINDINGS:  Per epic Blenda Mounts note 03/03/2024): Recent lumbar MRI imaging exhibits multi level mild to moderate degenerative changes. Moderate facet arthropathy noted at L3-L4. There is a facet joint effusion on the left at this level. No high grade spinal canal stenosis noted.   PATIENT SURVEYS:  Patient-Specific Activity Scoring Scheme  "0" represents "unable to perform." "10" represents "able to perform at prior level. 0 1 2 3 4 5 6 7 8 9  10 (Date and Score)   Activity Eval     1. ***      2. ***      3. ***    4.    5.    Score ***    Total score = sum of the activity scores/number of activities Minimum detectable change (90%CI) for average score = 2 points Minimum detectable change (90%CI) for single activity score = 3 points  SCREENING FOR RED FLAGS: Bowel or bladder incontinence: No Cauda equina syndrome: No  COGNITION: Overall cognitive status: WFL normal      SENSATION: WFL  MUSCLE LENGTH: Hamstrings: Right *** deg; Left *** deg Thomas test: Right *** deg; Left *** deg  POSTURE:  {posture:25561}  PALPATION: ***  LUMBAR ROM:   Directional Preference Assessment: Centralization: Peripheralization:   AROM eval  Flexion   Extension   Right lateral flexion   Left lateral flexion   Right rotation   Left rotation     (Blank rows = not tested)  LOWER EXTREMITY ROM:      Right eval Left eval  Hip flexion    Hip extension    Hip abduction    Hip adduction    Hip internal rotation    Hip external rotation    Knee flexion    Knee extension    Ankle dorsiflexion    Ankle plantarflexion    Ankle inversion    Ankle eversion     (Blank rows = not tested)  LOWER EXTREMITY MMT:    MMT Right eval Left eval  Hip flexion    Hip extension    Hip abduction    Hip adduction    Hip internal rotation    Hip external rotation    Knee flexion    Knee extension    Ankle dorsiflexion    Ankle plantarflexion    Ankle inversion    Ankle eversion     (Blank rows = not tested)  LUMBAR SPECIAL TESTS:  {lumbar special test:25242}  FUNCTIONAL TESTS:  {Functional tests:24029}  GAIT:  TODAY'S TREATMENT:                                                                                                         DATE: 03/11/2024  Therex:    HEP instruction/performance c cues for techniques, handout provided.  Trial set performed of each for comprehension and symptom assessment.  See below for exercise list  PATIENT EDUCATION:  Education details: HEP, POC Person educated: Patient Education method: Explanation, Demonstration, Verbal cues, and Handouts Education comprehension: verbalized understanding, returned demonstration, and verbal cues required  HOME EXERCISE PROGRAM: ***  ASSESSMENT:  CLINICAL IMPRESSION: Patient is a 78 y.o. who comes to clinic with complaints of bilateral low back pain with mobility, strength and movement coordination deficits that impair their ability to perform usual daily and recreational functional activities without increase difficulty/symptoms at this time.  Patient  to benefit from skilled PT services to address impairments and limitations to improve to previous level of function without restriction secondary to condition.   OBJECTIVE IMPAIRMENTS: {opptimpairments:25111}.   ACTIVITY LIMITATIONS: {activitylimitations:27494}  PARTICIPATION LIMITATIONS: {participationrestrictions:25113}  PERSONAL FACTORS: {Personal factors:25162} are also affecting patient's functional outcome.   REHAB POTENTIAL: {rehabpotential:25112}  CLINICAL DECISION MAKING: Evolving/moderate complexity  EVALUATION COMPLEXITY: Moderate   GOALS: Goals reviewed with patient? Yes  SHORT TERM GOALS: (target date for Short term goals are 3 weeks 04/01/2024)  1. Patient will demonstrate independent use of home exercise program to maintain progress from in clinic treatments.  Goal status: New  LONG TERM GOALS: (target dates for all long term goals are 10 weeks  *** )   1. Patient will demonstrate/report pain at worst less than or equal to 2/10 to facilitate minimal limitation in daily activity secondary to pain symptoms.  Goal status: New   2. Patient will demonstrate independent use of home exercise program to facilitate ability to maintain/progress functional gains from skilled physical therapy services.  Goal status: New   3. Patient will demonstrate Patient specific functional scale avg > or = *** to indicate reduced disability due to condition.   Goal status: New   4. Patient will demonstrate lumbar extension 100 % WFL s symptoms to facilitate upright standing, walking posture at PLOF s limitation.  Goal status: New   5.  ***  Goal status: New   6.  *** Goal status: New   7.  *** Goal Status: New  PLAN:  PT FREQUENCY: 1-2x/week  PT DURATION: 10 weeks  PLANNED INTERVENTIONS: Can include 16109- PT Re-evaluation, 97110-Therapeutic exercises, 97530- Therapeutic activity, 97112- Neuromuscular re-education, 97535- Self Care, 97140- Manual therapy, [phone]- Gait  training, [phone]- Orthotic Fit/training, [phone]- Canalith repositioning, [id number]- Aquatic Therapy, [phone]- Electrical stimulation (unattended), 97750 Physical performance testing, [id number]- Electrical stimulation (manual), 97016- Vasopneumatic device, [id number]- Ultrasound, [id number]- Traction (mechanical), [id number]- Ionotophoresis 4mg /ml Dexamethasone, Patient/Family education, Balance training, Stair training, Taping, Dry Needling, Joint mobilization, Joint manipulation, Spinal manipulation, Spinal mobilization, Scar mobilization, Vestibular training, Visual/preceptual remediation/compensation, DME instructions, Cryotherapy, and Moist heat.  All performed as medically necessary.  All included unless contraindicated  PLAN FOR NEXT SESSION: Review HEP knowledge/results.  Josephine Rudnick, Cammi Consalvo, Student-PT 03/10/2024, 5:00 PM

## 2024-03-11 ENCOUNTER — Ambulatory Visit: Admitting: Physical Therapy

## 2024-03-11 ENCOUNTER — Encounter: Payer: Self-pay | Admitting: Rehabilitative and Restorative Service Providers"

## 2024-03-11 DIAGNOSIS — M6281 Muscle weakness (generalized): Secondary | ICD-10-CM

## 2024-03-11 DIAGNOSIS — M5459 Other low back pain: Secondary | ICD-10-CM

## 2024-03-11 NOTE — Addendum Note (Signed)
 Addended by: Chyrel Masson B on: 03/11/2024 11:49 AM   Modules accepted: Orders

## 2024-03-18 ENCOUNTER — Ambulatory Visit: Admitting: Physical Therapy

## 2024-03-18 ENCOUNTER — Encounter: Payer: Self-pay | Admitting: Physical Therapy

## 2024-03-18 DIAGNOSIS — M6281 Muscle weakness (generalized): Secondary | ICD-10-CM

## 2024-03-18 DIAGNOSIS — M5459 Other low back pain: Secondary | ICD-10-CM

## 2024-03-18 NOTE — Therapy (Signed)
 OUTPATIENT PHYSICAL THERAPY TREATMENT    Patient Name: RANDA RISS MRN: [id number] DOB:1946-08-23, 78 y.o., female Today's Date: 03/18/2024  END OF SESSION:  PT End of Session - 03/18/24 1102     Visit Number 2    Number of Visits 20    Date for PT Re-Evaluation 05/20/24    Authorization Type Medicare A and B    Progress Note Due on Visit 10    PT Start Time 1102    PT Stop Time 1141    PT Time Calculation (min) 39 min    Activity Tolerance Patient tolerated treatment well;No increased pain    Behavior During Therapy WFL for tasks assessed/performed              Past Medical History:  Diagnosis Date   Anemia    in late twenties   Anxiety    Arthritis    "knees, fingers" (09/05/2014)   Diabetes mellitus without complication (HCC)    GERD (gastroesophageal reflux disease)    H/O hiatal hernia    Hyperlipidemia    Hypertension    Insomnia    Irritable bowel    with stress   Obesity    PONV (postoperative nausea and vomiting)    Past Surgical History:  Procedure Laterality Date   ABDOMINAL HYSTERECTOMY  1982   partial   BREAST BIOPSY Left    COLONOSCOPY     FOOT SURGERY Right ~ 2004   "top of my foot"   SHOULDER ARTHROSCOPY WITH OPEN ROTATOR CUFF REPAIR Right ~ 2000   THYROID LOBECTOMY Right 09/05/2014   THYROID LOBECTOMY Right 09/05/2014   Procedure: RIGHT THYROID LOBECTOMY;  Surgeon: Darnell Level, MD;  Location: [facility] OR;  Service: General;  Laterality: Right;   TUBAL LIGATION  1981   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    PCP: Sigmund Hazel, MD   REFERRING PROVIDER: Juanda Chance, NP   REFERRING DIAG:  [phone] (ICD-10-CM) - Chronic bilateral low back pain without sciatica  M47.816 (ICD-10-CM) - Facet arthropathy, lumbar  M79.7 (ICD-10-CM) - Fibromyalgia     Rationale for Evaluation and Treatment: Rehabilitation  THERAPY DIAG:  Other low back pain  Muscle weakness (generalized)  ONSET DATE: chronic  SUBJECTIVE:  SUBJECTIVE STATEMENT:   I am better since last time, I've been doing the exercises every day and the pain is not as bad in my hip. Had some pain in my leg now too but I think its from laying on the hard floor.    EVAL: Patient comes in with chronic lower back pain, left greater than right. Sometimes has the pain radiate down side and front of legs with it being felt more often on the left, right is fewer instances. Reports 2 lumps in low back that can feel hot to touch after activity. Uses Voltaren and other topical agents getting relief with them but patient states she just gets too busy with day to day life to reapply.  PERTINENT HISTORY:  Fibromyalgia, polymyalgia rheumatica, DM, GERD, HTN  PAIN:  NPRS scale: 4/10 Pain location: L hip and down Pain description: sore  Aggravating factors: laying on hard floor  Relieving factors: exercises  PRECAUTIONS: None  WEIGHT BEARING RESTRICTIONS: No  FALLS:  Has patient fallen in last 6 months? No  LIVING ENVIRONMENT: Lives with: lives with their family and lives with their spouse Lives in: House/apartment Stairs: Yes: External: 2 steps; none Has following equipment at home: None  OCCUPATION: retired  PLOF: Independent  PATIENT GOALS: "to get rid of the pain"  Next MD Visit: none scheduled at time of eval   OBJECTIVE:   DIAGNOSTIC FINDINGS:  Per epic Ellin Goodie, NP note 03/03/2024):  Recent lumbar MRI imaging exhibits multi level mild to moderate degenerative changes. Moderate facet arthropathy noted at L3-L4. There is a facet joint effusion on the left at this level. No high  grade spinal canal stenosis noted.   PATIENT SURVEYS:  Patient-Specific Activity Scoring Scheme  "0" represents "unable to perform." "10" represents "able to perform at prior level. 0 1 2 3 4 5 6 7 8 9  10 (Date and Score)   Activity 03/11/2024    1. Walk for long distances 4    2. Get up and down without pain 7    3. Stand for a long time 5   4.    5.    Score 5.33/10    Total score = sum of the activity scores/number of activities Minimum detectable change (90%CI) for average score = 2 points Minimum detectable change (90%CI) for single activity score = 3 points  SCREENING FOR RED FLAGS: 03/11/2024 Bowel or bladder incontinence: No Cauda equina syndrome: No  COGNITION: 03/11/2024 Overall cognitive status: WFL normal      SENSATION: 03/11/2024 WFL  MUSCLE LENGTH: 03/11/2024 Not tested  POSTURE:  03/11/2024 No Significant postural limitations  PALPATION: 03/11/2024 Tender to piriformis, glutes, and low back. Bilateral lumps noted of equal size on low back that are moveable and very tender to patient  LUMBAR ROM:  03/11/2024 Directional Preference Assessment: nonspecific Centralization: none observed Peripheralization:  none observed  AROM 03/11/2024  Flexion Reaches ankles  Extension 90%  Right lateral flexion Reaches thigh  Left lateral flexion Reaches thigh  Right rotation 75% Painful/symptomatic  Left rotation 75% Painful/symptomatic   (Blank rows = not tested)  LOWER EXTREMITY ROM:      Right 03/11/2024 Left 03/11/2024  Hip flexion    Hip extension    Hip abduction    Hip adduction    Hip internal rotation Limited due to pain Limited due to pain  Hip external rotation WFL; non-painful WFL; Non-painful  Knee flexion    Knee extension    Ankle dorsiflexion  Ankle plantarflexion    Ankle inversion    Ankle eversion     (Blank rows = not tested)  LOWER EXTREMITY MMT:    MMT Right 03/11/2024 Left 03/11/2024  Hip flexion 4 5  Hip extension     Hip abduction 4 4-  Hip adduction    Hip internal rotation    Hip external rotation    Knee flexion 5 5  Knee extension 5 5  Ankle dorsiflexion 5 5  Ankle plantarflexion    Ankle inversion    Ankle eversion     (Blank rows = not tested)  LUMBAR SPECIAL TESTS:  03/11/2024 Slump test: Negative bilaterally   FUNCTIONAL TESTS:  03/11/2024 None tested  GAIT: 03/11/2024 Ambulates clinical distances without AD. Wide step length, appropriate foot clearance, and cadence                                                                                                                                                                                                                   TODAY'S TREATMENT:                                                                                                         DATE:   03/18/24  Nustep L5x8 minutes all four extremities for w/u, tissue perfusion and mm endurance  Bridges + ABD into red TB 2x12  Sidelying hip ABD red TB 1x10 B  Figure 4 stretch 2x30 seconds Hooklying HS stretches 2x30 seconds B  Standing hip hikes x12 B   Percussion gun lateral L thigh with towel for padding to address mm spasms and trigger points          03/11/2024  Therex:    HEP instruction/performance c cues for techniques, handout provided.  Trial set performed of each for comprehension and symptom assessment.  See below for exercise list  Neuro Re-ed:  Pain neuroscience education with discussion regarding chronic neural sensitivity, as well as education on muscle tightness/trigger point connection to pain presentation.  Question and answer time performed.    PATIENT EDUCATION:  Education details: HEP, POC Person educated: Patient Education method: Explanation, Demonstration, Verbal cues, and Handouts Education comprehension: verbalized understanding, returned demonstration, and verbal cues required  HOME EXERCISE PROGRAM: Access Code: [email or web address] URL:  [email or web address] Date: 03/11/2024 Prepared by: Chyrel Masson   Exercises - Supine Figure 4 Piriformis Stretch  - 2-3 x daily - 7 x weekly - 1 sets - 5 reps - 15-30 hold - Supine Lower Trunk Rotation  - 2-3 x daily - 7 x weekly - 1 sets - 3-5 reps - 15 hold - Supine Bridge  - 1-2 x daily - 7 x weekly - 1-2 sets - 10 reps - 2 hold - Clamshell  - 1-2 x daily - 7 x weekly - 2-3 sets - 10 reps  ASSESSMENT:  CLINICAL IMPRESSION:  Pt arrives feeling a bit better from her HEP, did report some mm soreness from doing home exercises on the floor- encouraged her to do these on the bed instead to avoid this. Generally worked on proximal strengthening as well as core work today. Does have (+) trendelenburg as expected given noted hip ABD weakness found at eval. Motivated to improve, will progress challenges as able.       EVAL: Patient is a 78 y.o. who comes to clinic with complaints of bilateral low back pain with mobility, strength and movement coordination deficits that impair their ability to perform usual daily and recreational functional activities without increase difficulty/symptoms at this time.  Patient to benefit from skilled PT services to address impairments and limitations to improve to previous level of function without restriction secondary to condition.   OBJECTIVE IMPAIRMENTS: Abnormal gait, decreased activity tolerance, decreased balance, decreased endurance, decreased knowledge of condition, decreased ROM, decreased strength, increased muscle spasms, impaired flexibility, and pain.   ACTIVITY LIMITATIONS: carrying, lifting, bending, sitting, standing, squatting, sleeping, stairs, and locomotion level  PARTICIPATION LIMITATIONS: meal prep, cleaning, laundry, driving, shopping, and community activity  PERSONAL FACTORS: Age, Time since onset of injury/illness/exacerbation, and 3+ comorbidities: see above  are also affecting patient's functional outcome.   REHAB  POTENTIAL: Good  CLINICAL DECISION MAKING: Evolving/moderate complexity  EVALUATION COMPLEXITY: Moderate   GOALS: Goals reviewed with patient? Yes  SHORT TERM GOALS: (target date for Short term goals are 3 weeks 04/01/2024)  1. Patient will demonstrate independent use of home exercise program to maintain progress from in clinic treatments.  Goal status: New  LONG TERM GOALS: (target dates for all long term goals are 10 weeks  05/20/2024 )   1. Patient will demonstrate/report pain at worst less than or equal to 2/10 to facilitate minimal limitation in daily activity secondary to pain symptoms.  Goal status: New   2. Patient will demonstrate independent use of home exercise program to facilitate ability to maintain/progress functional gains from skilled physical therapy services.  Goal status: New   3. Patient will demonstrate Patient specific functional scale avg > or = 8 to indicate reduced disability due to condition.   Goal status: New   4. Patient will demonstrate lumbar rotation 100 % WFL s symptoms to facilitate upright standing, walking posture at PLOF s limitation.  Goal status: New   5.  Patient will demonstrate STS without onset of symptoms to meet PLOF Goal status: New   6.  Patient will report long distance ambulation without pain or increased symptoms Goal status: New    PLAN:  PT FREQUENCY: 1-2x/week  PT DURATION: 10 weeks  PLANNED INTERVENTIONS: Can include 29528- PT Re-evaluation, 97110-Therapeutic exercises, 97530- Therapeutic  activity, [id number]- Neuromuscular re-education, [phone]- Self Care, 40981- Manual therapy, [phone]- Gait training, [phone]- Orthotic Fit/training, [phone]- Canalith repositioning, [id number]- Aquatic Therapy, [phone]- Electrical stimulation (unattended), 97750 Physical performance testing, [phone]- Electrical stimulation (manual), 97016- Vasopneumatic device, [id number]- Ultrasound, [id number]- Traction (mechanical), [id number]- Ionotophoresis 4mg /ml Dexamethasone,  Patient/Family education, Balance training, Stair training, Taping, Dry Needling, Joint mobilization, Joint manipulation, Spinal manipulation, Spinal mobilization, Scar mobilization, Vestibular training, Visual/preceptual remediation/compensation, DME instructions, Cryotherapy, and Moist heat.  All performed as medically necessary.  All included unless contraindicated  PLAN FOR NEXT SESSION: Review HEP knowledge/results.  Check balance, 30 sec STS?      Nedra Hai, PT, DPT 03/18/24 11:41 AM

## 2024-03-21 ENCOUNTER — Ambulatory Visit (INDEPENDENT_AMBULATORY_CARE_PROVIDER_SITE_OTHER): Admitting: Physical Therapy

## 2024-03-21 ENCOUNTER — Encounter: Payer: Self-pay | Admitting: Physical Therapy

## 2024-03-21 DIAGNOSIS — M5459 Other low back pain: Secondary | ICD-10-CM

## 2024-03-21 DIAGNOSIS — M6281 Muscle weakness (generalized): Secondary | ICD-10-CM

## 2024-03-21 NOTE — Therapy (Signed)
 OUTPATIENT PHYSICAL THERAPY TREATMENT    Patient Name: GRIER CZERWINSKI MRN: [id number] DOB:Jan 17, 1946, 78 y.o., female Today's Date: 03/21/2024  END OF SESSION:  PT End of Session - 03/21/24 1028     Visit Number 3    Number of Visits 20    Date for PT Re-Evaluation 05/20/24    Authorization Type Medicare A and B    Progress Note Due on Visit 10    PT Start Time 1017    PT Stop Time 1057    PT Time Calculation (min) 40 min    Activity Tolerance Patient tolerated treatment well;No increased pain    Behavior During Therapy WFL for tasks assessed/performed               Past Medical History:  Diagnosis Date   Anemia    in late twenties   Anxiety    Arthritis    "knees, fingers" (09/05/2014)   Diabetes mellitus without complication (HCC)    GERD (gastroesophageal reflux disease)    H/O hiatal hernia    Hyperlipidemia    Hypertension    Insomnia    Irritable bowel    with stress   Obesity    PONV (postoperative nausea and vomiting)    Past Surgical History:  Procedure Laterality Date   ABDOMINAL HYSTERECTOMY  1982   partial   BREAST BIOPSY Left    COLONOSCOPY     FOOT SURGERY Right ~ 2004   "top of my foot"   SHOULDER ARTHROSCOPY WITH OPEN ROTATOR CUFF REPAIR Right ~ 2000   THYROID LOBECTOMY Right 09/05/2014   THYROID LOBECTOMY Right 09/05/2014   Procedure: RIGHT THYROID LOBECTOMY;  Surgeon: Darnell Level, MD;  Location: [facility] OR;  Service: General;  Laterality: Right;   TUBAL LIGATION  1981   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  Pt reporting her HEP is going well.    EVAL: Patient comes in with chronic lower back pain, left greater than right. Sometimes has the pain radiate down side and front of legs with it being felt more often on the left, right is fewer instances. Reports 2 lumps in low back that can feel hot to touch after activity. Uses Voltaren and other topical agents getting relief with them but patient states she just gets too busy with day to day life to reapply.  PERTINENT HISTORY:  Fibromyalgia, polymyalgia rheumatica, DM, GERD, HTN  PAIN:  NPRS scale: 3-4/10 Pain location: L hip and down Pain description: sore  Aggravating factors: laying on hard floor  Relieving factors: exercises  PRECAUTIONS: None  WEIGHT BEARING RESTRICTIONS: No  FALLS:  Has patient fallen in last 6 months? No  LIVING ENVIRONMENT: Lives with: lives with their family and lives with their spouse Lives in: House/apartment Stairs: Yes: External: 2 steps; none Has following equipment at home: None  OCCUPATION: retired  PLOF: Independent  PATIENT GOALS: "to get rid of the pain"  Next MD Visit: none scheduled at time of eval   OBJECTIVE:   DIAGNOSTIC FINDINGS:  Per epic Ellin Goodie, NP note 03/03/2024):  Recent lumbar MRI imaging exhibits multi level mild to moderate degenerative changes. Moderate facet arthropathy noted at L3-L4. There is a facet joint effusion on the left at this level. No high grade spinal canal stenosis noted.   PATIENT SURVEYS:   Patient-Specific Activity Scoring Scheme  "0" represents "unable to perform." "10" represents "able to perform at prior level. 0 1 2 3 4 5 6 7 8 9  10 (Date and Score)   Activity 03/11/2024    1. Walk for long distances 4    2. Get up and down without pain 7    3. Stand for a long time 5   4.    5.    Score 5.33/10    Total score = sum of the activity scores/number of activities Minimum detectable change (90%CI) for average score = 2 points Minimum detectable change (90%CI) for single activity score = 3 points  SCREENING FOR RED FLAGS: 03/11/2024 Bowel or bladder incontinence: No Cauda equina syndrome: No  COGNITION: 03/11/2024 Overall cognitive status: WFL normal      SENSATION: 03/11/2024 WFL  MUSCLE LENGTH: 03/11/2024 Not tested  POSTURE:  03/11/2024 No Significant postural limitations  PALPATION: 03/11/2024 Tender to piriformis, glutes, and low back. Bilateral lumps noted of equal size on low back that are moveable and very tender to patient  LUMBAR ROM:  03/11/2024 Directional Preference Assessment: nonspecific Centralization: none observed Peripheralization:  none observed  AROM 03/11/2024  Flexion Reaches ankles  Extension 90%  Right lateral flexion Reaches thigh  Left lateral flexion Reaches thigh  Right rotation 75% Painful/symptomatic  Left rotation 75% Painful/symptomatic   (Blank rows = not tested)  LOWER EXTREMITY ROM:      Right 03/11/2024 Left 03/11/2024  Hip flexion    Hip extension    Hip abduction    Hip adduction    Hip internal rotation Limited due to pain Limited due to pain  Hip external rotation WFL; non-painful WFL; Non-painful  Knee flexion    Knee extension    Ankle dorsiflexion    Ankle plantarflexion    Ankle inversion    Ankle eversion     (Blank rows = not tested)  LOWER EXTREMITY MMT:    MMT Right 03/11/2024 Left 03/11/2024  Hip flexion 4 5  Hip extension    Hip abduction 4 4-  Hip adduction    Hip internal  rotation    Hip external rotation    Knee flexion 5 5  Knee extension 5 5  Ankle dorsiflexion 5 5  Ankle plantarflexion    Ankle inversion    Ankle eversion     (Blank rows = not tested)  LUMBAR SPECIAL TESTS:  03/11/2024 Slump test: Negative bilaterally   FUNCTIONAL TESTS:  03/11/2024 None tested  GAIT: 03/11/2024 Ambulates clinical distances without AD. Wide step length, appropriate foot clearance, and cadence                                                                                                                                                                                                                   TODAY'S TREATMENT:                                                                                                         DATE:  03/21/24:  TherEx:  Bridges: 2 x 10 holding 5 sec Seated clam shells green TB Trunk rotation x 4 bil holding 30 sec Hamstring stretch: x 3 holding 20 sec bil LE SLR: 2 x 10 bil LE TherActivities:  30 sec sit to stand: 10 times c no UE support Leg Press: 62# x 10, single leg 31# x 10 bil LE Side stepping 20 feet x 4  Side stepping over 6 inch hurdle Step ups on 6 inch step x 10 bil LE, c single UE support Manual:  Percussion to left lumbar paraspinals and down Left lateral quad/IT band     03/18/24  Nustep L5x8 minutes all four extremities for w/u, tissue perfusion and mm endurance  Bridges + ABD into red TB 2x12  Sidelying hip ABD red TB 1x10 B  Figure 4 stretch 2x30 seconds Hooklying HS stretches 2x30 seconds B  Standing hip hikes x12 B   Percussion gun lateral L thigh with towel for  padding to address mm spasms and trigger points          03/11/2024  Therex:    HEP instruction/performance c cues for techniques, handout provided.  Trial set performed of each for comprehension and symptom assessment.  See below for exercise list  Neuro Re-ed:  Pain neuroscience education with discussion regarding chronic neural  sensitivity, as well as education on muscle tightness/trigger point connection to pain presentation.  Question and answer time performed.    PATIENT EDUCATION:  Education details: HEP, POC Person educated: Patient Education method: Programmer, multimedia, Demonstration, Verbal cues, and Handouts Education comprehension: verbalized understanding, returned demonstration, and verbal cues required  HOME EXERCISE PROGRAM: Access Code: [email or web address] URL: [email or web address] Date: 03/11/2024 Prepared by: Chyrel Masson   Exercises - Supine Figure 4 Piriformis Stretch  - 2-3 x daily - 7 x weekly - 1 sets - 5 reps - 15-30 hold - Supine Lower Trunk Rotation  - 2-3 x daily - 7 x weekly - 1 sets - 3-5 reps - 15 hold - Supine Bridge  - 1-2 x daily - 7 x weekly - 1-2 sets - 10 reps - 2 hold - Clamshell  - 1-2 x daily - 7 x weekly - 2-3 sets - 10 reps  ASSESSMENT:  CLINICAL IMPRESSION: Pt tolerating exercises well today. Pt reporting compliance in her HEP and feels like it has helped since her first visit. We discussed using her family members percussion device at home as needed. Continue to progress toward goals set.       EVAL: Patient is a 78 y.o. who comes to clinic with complaints of bilateral low back pain with mobility, strength and movement coordination deficits that impair their ability to perform usual daily and recreational functional activities without increase difficulty/symptoms at this time.  Patient to benefit from skilled PT services to address impairments and limitations to improve to previous level of function without restriction secondary to condition.   OBJECTIVE IMPAIRMENTS: Abnormal gait, decreased activity tolerance, decreased balance, decreased endurance, decreased knowledge of condition, decreased ROM, decreased strength, increased muscle spasms, impaired flexibility, and pain.   ACTIVITY LIMITATIONS: carrying, lifting, bending, sitting, standing, squatting, sleeping,  stairs, and locomotion level  PARTICIPATION LIMITATIONS: meal prep, cleaning, laundry, driving, shopping, and community activity  PERSONAL FACTORS: Age, Time since onset of injury/illness/exacerbation, and 3+ comorbidities: see above  are also affecting patient's functional outcome.   REHAB POTENTIAL: Good  CLINICAL DECISION MAKING: Evolving/moderate complexity  EVALUATION COMPLEXITY: Moderate   GOALS: Goals reviewed with patient? Yes  SHORT TERM GOALS: (target date for Short term goals are 3 weeks 04/01/2024)  1. Patient will demonstrate independent use of home exercise program to maintain progress from in clinic treatments.  Goal status: MET 03/21/24  LONG TERM GOALS: (target dates for all long term goals are 10 weeks  05/20/2024 )   1. Patient will demonstrate/report pain at worst less than or equal to 2/10 to facilitate minimal limitation in daily activity secondary to pain symptoms.  Goal status: New   2. Patient will demonstrate independent use of home exercise program to facilitate ability to maintain/progress functional gains from skilled physical therapy services.  Goal status: New   3. Patient will demonstrate Patient specific functional scale avg > or = 8 to indicate reduced disability due to condition.   Goal status: New   4. Patient will demonstrate lumbar rotation 100 % WFL s symptoms to facilitate upright standing, walking posture at PLOF s limitation.  Goal status: New   5.  Patient will demonstrate STS without onset of symptoms to meet PLOF Goal status: On-going 03/21/24   6.  Patient will report long distance ambulation without pain or increased symptoms Goal status: New    PLAN:  PT FREQUENCY: 1-2x/week  PT DURATION: 10 weeks  PLANNED INTERVENTIONS: Can include 96295- PT Re-evaluation, 97110-Therapeutic exercises, 97530- Therapeutic activity, 97112- Neuromuscular re-education, 97535- Self Care, 97140- Manual therapy, [phone]- Gait training, [phone]-  Orthotic Fit/training, [phone]- Canalith repositioning, [id number]- Aquatic Therapy, [phone]- Electrical stimulation (unattended), 97750 Physical performance testing, [id number]- Electrical stimulation (manual), 97016- Vasopneumatic device, [id number]- Ultrasound, [id number]- Traction (mechanical), [id number]- Ionotophoresis 4mg /ml Dexamethasone, Patient/Family education, Balance training, Stair training, Taping, Dry Needling, Joint mobilization, Joint manipulation, Spinal manipulation, Spinal mobilization, Scar mobilization, Vestibular training, Visual/preceptual remediation/compensation, DME instructions, Cryotherapy, and Moist heat.  All performed as medically necessary.  All included unless contraindicated  PLAN FOR NEXT SESSION: Progress core strengthening and stretching as tolerated.      Narda Amber, PT, MPT 03/21/24 10:55 AM   03/21/24 10:55 AM

## 2024-03-30 ENCOUNTER — Encounter: Admitting: Rehabilitative and Restorative Service Providers"

## 2024-04-05 ENCOUNTER — Encounter: Admitting: Rehabilitative and Restorative Service Providers"

## 2024-04-05 DIAGNOSIS — M25511 Pain in right shoulder: Secondary | ICD-10-CM | POA: Diagnosis not present

## 2024-04-05 DIAGNOSIS — M79641 Pain in right hand: Secondary | ICD-10-CM | POA: Diagnosis not present

## 2024-04-05 DIAGNOSIS — M353 Polymyalgia rheumatica: Secondary | ICD-10-CM | POA: Diagnosis not present

## 2024-04-05 DIAGNOSIS — M25512 Pain in left shoulder: Secondary | ICD-10-CM | POA: Diagnosis not present

## 2024-04-05 DIAGNOSIS — M79642 Pain in left hand: Secondary | ICD-10-CM | POA: Diagnosis not present

## 2024-04-05 DIAGNOSIS — R7989 Other specified abnormal findings of blood chemistry: Secondary | ICD-10-CM | POA: Diagnosis not present

## 2024-04-05 DIAGNOSIS — Z6829 Body mass index (BMI) 29.0-29.9, adult: Secondary | ICD-10-CM | POA: Diagnosis not present

## 2024-04-05 DIAGNOSIS — E663 Overweight: Secondary | ICD-10-CM | POA: Diagnosis not present

## 2024-04-05 DIAGNOSIS — M1991 Primary osteoarthritis, unspecified site: Secondary | ICD-10-CM | POA: Diagnosis not present

## 2024-04-08 ENCOUNTER — Encounter: Payer: Self-pay | Admitting: Rehabilitative and Restorative Service Providers"

## 2024-04-08 ENCOUNTER — Ambulatory Visit: Admitting: Rehabilitative and Restorative Service Providers"

## 2024-04-08 DIAGNOSIS — M5459 Other low back pain: Secondary | ICD-10-CM

## 2024-04-08 DIAGNOSIS — M6281 Muscle weakness (generalized): Secondary | ICD-10-CM

## 2024-04-08 NOTE — Therapy (Signed)
 OUTPATIENT PHYSICAL THERAPY TREATMENT    Patient Name: Sheena Trevino MRN: [id number] DOB:1946/04/17, 78 y.o., female Today's Date: 04/08/2024  END OF SESSION:  PT End of Session - 04/08/24 1028     Visit Number 4    Number of Visits 20    Date for PT Re-Evaluation 05/20/24    Authorization Type Medicare A and B    Progress Note Due on Visit 10    PT Start Time 1009    PT Stop Time 1048    PT Time Calculation (min) 39 min    Activity Tolerance Patient tolerated treatment well    Behavior During Therapy WFL for tasks assessed/performed                Past Medical History:  Diagnosis Date   Anemia    in late twenties   Anxiety    Arthritis    "knees, fingers" (09/05/2014)   Diabetes mellitus without complication (HCC)    GERD (gastroesophageal reflux disease)    H/O hiatal hernia    Hyperlipidemia    Hypertension    Insomnia    Irritable bowel    with stress   Obesity    PONV (postoperative nausea and vomiting)    Past Surgical History:  Procedure Laterality Date   ABDOMINAL HYSTERECTOMY  1982   partial   BREAST BIOPSY Left    COLONOSCOPY     FOOT SURGERY Right ~ 2004   "top of my foot"   SHOULDER ARTHROSCOPY WITH OPEN ROTATOR CUFF REPAIR Right ~ 2000   THYROID LOBECTOMY Right 09/05/2014   THYROID LOBECTOMY Right 09/05/2014   Procedure: RIGHT THYROID LOBECTOMY;  Surgeon: Darnell Level, MD;  Location: [facility] OR;  Service: General;  Laterality: Right;   TUBAL LIGATION  1981   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    PCP: Sigmund Hazel, MD   REFERRING PROVIDER: Juanda Chance, NP   REFERRING DIAG:  [phone] (ICD-10-CM) - Chronic bilateral low back pain without sciatica  M47.816 (ICD-10-CM) - Facet arthropathy, lumbar  M79.7 (ICD-10-CM) - Fibromyalgia     Rationale for Evaluation and Treatment: Rehabilitation  THERAPY DIAG:  Other low back pain  Muscle weakness (generalized)  ONSET DATE: chronic  SUBJECTIVE:  SUBJECTIVE STATEMENT: Pt indicated having complaints in lateral Lt hip/thigh today chief complaint.  Reported back not as bad usually.   PERTINENT HISTORY:  Fibromyalgia, polymyalgia rheumatica, DM, GERD, HTN  PAIN:  NPRS scale: 5-6/10 Pain location: Lt hip and down Pain description: sore  Aggravating factors: laying on hard floor  Relieving factors: exercises  PRECAUTIONS: None  WEIGHT BEARING RESTRICTIONS: No  FALLS:  Has patient fallen in last 6 months? No  LIVING ENVIRONMENT: Lives with: lives with their family and lives with their spouse Lives in: House/apartment Stairs: Yes: External: 2 steps; none Has following equipment at home: None  OCCUPATION: retired  PLOF: Independent  PATIENT GOALS: "to get rid of the pain"  Next MD Visit: none scheduled at time of eval   OBJECTIVE:   DIAGNOSTIC FINDINGS:  Per epic Ellin Goodie, NP note 03/03/2024):  Recent lumbar MRI imaging exhibits multi level mild to moderate degenerative changes. Moderate facet arthropathy noted at L3-L4. There is a facet joint effusion on the left at this level. No high grade spinal canal stenosis noted.   PATIENT SURVEYS:  Patient-Specific Activity Scoring Scheme  "0" represents "unable to perform." "10" represents "able to perform at prior level. 0 1 2 3 4 5 6 7 8 9  10 (Date and Score)   Activity 03/11/2024  04/08/2024  1. Walk for long distances 4 5   2. Get up and down without pain 7  10  3. Stand for a long time 5 5  4.    5.    Score 5.33/10 6.667   Total score = sum of the activity scores/number of activities Minimum detectable  change (90%CI) for average score = 2 points Minimum detectable change (90%CI) for single activity score = 3 points  SCREENING FOR RED FLAGS: 03/11/2024 Bowel or bladder incontinence: No Cauda equina syndrome: No  COGNITION: 03/11/2024 Overall cognitive status: WFL normal      SENSATION: 03/11/2024 WFL  MUSCLE LENGTH: 03/11/2024 Not tested  POSTURE:  03/11/2024 No Significant postural limitations  PALPATION: 03/11/2024 Tender to piriformis, glutes, and low back. Bilateral lumps noted of equal size on low back that are moveable and very tender to patient  LUMBAR ROM:  03/11/2024 Directional Preference Assessment: nonspecific Centralization: none observed Peripheralization:  none observed  AROM 03/11/2024  Flexion Reaches ankles  Extension 90%  Right lateral flexion Reaches thigh  Left lateral flexion Reaches thigh  Right rotation 75% Painful/symptomatic  Left rotation 75% Painful/symptomatic   (Blank rows = not tested)  LOWER EXTREMITY ROM:      Right 03/11/2024 Left 03/11/2024  Hip flexion    Hip extension    Hip abduction    Hip adduction    Hip internal rotation Limited due to pain Limited due to pain  Hip external rotation WFL; non-painful WFL; Non-painful  Knee flexion    Knee extension    Ankle dorsiflexion    Ankle plantarflexion    Ankle inversion    Ankle eversion     (Blank rows = not tested)  LOWER EXTREMITY MMT:    MMT Right 03/11/2024 Left 03/11/2024  Hip flexion 4 5  Hip extension    Hip abduction 4 4-  Hip adduction    Hip internal rotation    Hip external rotation    Knee flexion 5 5  Knee extension 5 5  Ankle dorsiflexion 5 5  Ankle plantarflexion    Ankle inversion    Ankle eversion     (Blank rows = not tested)  LUMBAR SPECIAL TESTS:  03/11/2024 Slump test: Negative bilaterally   FUNCTIONAL TESTS:  03/11/2024 None tested  GAIT: 03/11/2024 Ambulates clinical distances without AD. Wide step length, appropriate foot clearance,  and cadence                                                                                                                                                                                                                   TODAY'S TREATMENT:                                                                                                         DATE: 04/08/2024  Therex: Lt hip clam shell 2 x 15 Supine bridge c green band hip abduction hold 2 x 10  Supine knee to opposite shoulder 15 sec x 3 bilateral Supine figure 4 push away 15 sec x 3 bilateral Additional time spent in verbal review of exercise techniques Nustep lvl 6 9 mins UE/LE    Manual Rt sidelying Lt lateral hip/thigh percussive device.     TODAY'S TREATMENT:                                                                                                         DATE:  03/21/24:  TherEx:  Bridges: 2 x 10 holding 5 sec Seated clam shells green TB Trunk rotation x 4 bil holding 30 sec Hamstring stretch: x 3 holding 20 sec bil LE SLR: 2 x 10 bil LE TherActivities:  30 sec sit to stand: 10 times c no UE support Leg Press: 62# x 10, single leg 31# x 10 bil LE Side stepping 20 feet x  4  Side stepping over 6 inch hurdle Step ups on 6 inch step x 10 bil LE, c single UE support Manual:  Percussion to left lumbar paraspinals and down Left lateral quad/IT band  TODAY'S TREATMENT:                                                                                                         DATE:03/18/24  Nustep L5x8 minutes all four extremities for w/u, tissue perfusion and mm endurance  Bridges + ABD into red TB 2x12  Sidelying hip ABD red TB 1x10 B  Figure 4 stretch 2x30 seconds Hooklying HS stretches 2x30 seconds B  Standing hip hikes x12 B   Percussion gun lateral L thigh with towel for padding to address mm spasms and trigger points   TODAY'S TREATMENT:                                                                                                          DATE:03/11/2024  Therex:    HEP instruction/performance c cues for techniques, handout provided.  Trial set performed of each for comprehension and symptom assessment.  See below for exercise list  Neuro Re-ed:  Pain neuroscience education with discussion regarding chronic neural sensitivity, as well as education on muscle tightness/trigger point connection to pain presentation.  Question and answer time performed.    PATIENT EDUCATION:  Education details: HEP update Person educated: Patient Education method: Explanation, Demonstration, Verbal cues, and Handouts Education comprehension: verbalized understanding, returned demonstration, and verbal cues required  HOME EXERCISE PROGRAM: Access Code: [email or web address] URL: [email or web address] Date: 04/08/2024 Prepared by: Chyrel Masson  Exercises - Supine Figure 4 Piriformis Stretch  - 2-3 x daily - 7 x weekly - 1 sets - 5 reps - 15-30 hold - Supine Piriformis Stretch with Foot on Ground  - 2-3 x daily - 7 x weekly - 1 sets - 5 reps - 15-30 hold - Supine Lower Trunk Rotation  - 2-3 x daily - 7 x weekly - 1 sets - 3-5 reps - 15 hold - Supine Bridge  - 1-2 x daily - 7 x weekly - 1-2 sets - 10 reps - 2 hold - Clamshell  - 1-2 x daily - 7 x weekly - 2-3 sets - 10 reps - Sit to Stand  - 3 x daily - 7 x weekly - 1 sets - 10 reps  ASSESSMENT:  CLINICAL IMPRESSION: Tenderness with trigger points in Lt glute med/min and lateral quad noted and treated with percussive device.  Continued efforts on progressive strengthening and mobiity gains for back/hip to improve movement ablity.  Continued skilled PT services  indicated at this time.   Gain on tranfers ability was noted in PSFS.    OBJECTIVE IMPAIRMENTS: Abnormal gait, decreased activity tolerance, decreased balance, decreased endurance, decreased knowledge of condition, decreased ROM, decreased strength, increased muscle spasms, impaired flexibility, and pain.    ACTIVITY LIMITATIONS: carrying, lifting, bending, sitting, standing, squatting, sleeping, stairs, and locomotion level  PARTICIPATION LIMITATIONS: meal prep, cleaning, laundry, driving, shopping, and community activity  PERSONAL FACTORS: Age, Time since onset of injury/illness/exacerbation, and 3+ comorbidities: see above  are also affecting patient's functional outcome.   REHAB POTENTIAL: Good  CLINICAL DECISION MAKING: Evolving/moderate complexity  EVALUATION COMPLEXITY: Moderate   GOALS: Goals reviewed with patient? Yes  SHORT TERM GOALS: (target date for Short term goals are 3 weeks 04/01/2024)  1. Patient will demonstrate independent use of home exercise program to maintain progress from in clinic treatments.  Goal status: MET 03/21/24  LONG TERM GOALS: (target dates for all long term goals are 10 weeks  05/20/2024 )   1. Patient will demonstrate/report pain at worst less than or equal to 2/10 to facilitate minimal limitation in daily activity secondary to pain symptoms.  Goal status: New   2. Patient will demonstrate independent use of home exercise program to facilitate ability to maintain/progress functional gains from skilled physical therapy services.  Goal status: New   3. Patient will demonstrate Patient specific functional scale avg > or = 8 to indicate reduced disability due to condition.   Goal status: New   4. Patient will demonstrate lumbar rotation 100 % WFL s symptoms to facilitate upright standing, walking posture at PLOF s limitation.  Goal status: New   5.  Patient will demonstrate STS without onset of symptoms to meet PLOF Goal status: On-going 03/21/24   6.  Patient will report long distance ambulation without pain or increased symptoms Goal status: New    PLAN:  PT FREQUENCY: 1-2x/week  PT DURATION: 10 weeks  PLANNED INTERVENTIONS: Can include 40981- PT Re-evaluation, 97110-Therapeutic exercises, 97530- Therapeutic activity, 97112-  Neuromuscular re-education, 97535- Self Care, 97140- Manual therapy, [phone]- Gait training, [phone]- Orthotic Fit/training, [phone]- Canalith repositioning, [id number]- Aquatic Therapy, [phone]- Electrical stimulation (unattended), 97750 Physical performance testing, [id number]- Electrical stimulation (manual), 97016- Vasopneumatic device, [id number]- Ultrasound, [id number]- Traction (mechanical), [id number]- Ionotophoresis 4mg /ml Dexamethasone, Patient/Family education, Balance training, Stair training, Taping, Dry Needling, Joint mobilization, Joint manipulation, Spinal manipulation, Spinal mobilization, Scar mobilization, Vestibular training, Visual/preceptual remediation/compensation, DME instructions, Cryotherapy, and Moist heat.  All performed as medically necessary.  All included unless contraindicated  PLAN FOR NEXT SESSION: Percussive device as needed.  Recheck hip strength.     Chyrel Masson, PT, DPT, OCS, ATC 04/08/24  10:46 AM

## 2024-04-19 ENCOUNTER — Encounter: Payer: Self-pay | Admitting: Rehabilitative and Restorative Service Providers"

## 2024-04-19 ENCOUNTER — Ambulatory Visit (INDEPENDENT_AMBULATORY_CARE_PROVIDER_SITE_OTHER): Admitting: Rehabilitative and Restorative Service Providers"

## 2024-04-19 DIAGNOSIS — M5459 Other low back pain: Secondary | ICD-10-CM

## 2024-04-19 DIAGNOSIS — M6281 Muscle weakness (generalized): Secondary | ICD-10-CM

## 2024-04-19 NOTE — Therapy (Signed)
 OUTPATIENT PHYSICAL THERAPY TREATMENT    Patient Name: Sheena Trevino MRN: [id number] DOB:1946/09/26, 78 y.o., female Today's Date: 04/19/2024  END OF SESSION:  PT End of Session - 04/19/24 1103     Visit Number 5    Number of Visits 20    Date for PT Re-Evaluation 05/20/24    Authorization Type Medicare A and B    Progress Note Due on Visit 10    PT Start Time 1056    PT Stop Time 1138    PT Time Calculation (min) 42 min    Activity Tolerance Patient tolerated treatment well    Behavior During Therapy WFL for tasks assessed/performed                 Past Medical History:  Diagnosis Date   Anemia    in late twenties   Anxiety    Arthritis    "knees, fingers" (09/05/2014)   Diabetes mellitus without complication (HCC)    GERD (gastroesophageal reflux disease)    H/O hiatal hernia    Hyperlipidemia    Hypertension    Insomnia    Irritable bowel    with stress   Obesity    PONV (postoperative nausea and vomiting)    Past Surgical History:  Procedure Laterality Date   ABDOMINAL HYSTERECTOMY  1982   partial   BREAST BIOPSY Left    COLONOSCOPY     FOOT SURGERY Right ~ 2004   "top of my foot"   SHOULDER ARTHROSCOPY WITH OPEN ROTATOR CUFF REPAIR Right ~ 2000   THYROID  LOBECTOMY Right 09/05/2014   THYROID  LOBECTOMY Right 09/05/2014   Procedure: RIGHT THYROID  LOBECTOMY;  Surgeon: Oralee Billow, MD;  Location: [facility] OR;  Service: General;  Laterality: Right;   TUBAL LIGATION  1981   Patient Active Problem List   Diagnosis Date Noted   Diabetes mellitus without complication (HCC)    Colon polyps 12/29/2014   Right thyroid  nodule 09/05/2014   Neoplasm of uncertain behavior of thyroid  gland, right 01/03/2013    PCP: Perley Bradley, MD   REFERRING PROVIDER: Darryll Eng, NP   REFERRING DIAG:  [phone] (ICD-10-CM) - Chronic bilateral low back pain without sciatica  M47.816 (ICD-10-CM) - Facet arthropathy, lumbar  M79.7 (ICD-10-CM) - Fibromyalgia     Rationale for Evaluation and Treatment: Rehabilitation  THERAPY DIAG:  Other low back pain  Muscle weakness (generalized)  ONSET DATE: chronic  SUBJECTIVE:  SUBJECTIVE STATEMENT: Pt indicated having some improvements but has noted Lt hip area still can hurt up to 6/10, noted in the afternoon.     PERTINENT HISTORY:  Fibromyalgia, polymyalgia rheumatica, DM, GERD, HTN  PAIN:  NPRS scale: up to 6/10.  Pain location: Lt hip and down Pain description: sore  Aggravating factors: laying on hard floor  Relieving factors: exercises  PRECAUTIONS: None  WEIGHT BEARING RESTRICTIONS: No  FALLS:  Has patient fallen in last 6 months? No  LIVING ENVIRONMENT: Lives with: lives with their family and lives with their spouse Lives in: House/apartment Stairs: Yes: External: 2 steps; none Has following equipment at home: None  OCCUPATION: retired  PLOF: Independent  PATIENT GOALS: "to get rid of the pain"  Next MD Visit: none scheduled at time of eval   OBJECTIVE:   DIAGNOSTIC FINDINGS:  Per epic Elvan Hamel, NP note 03/03/2024):  Recent lumbar MRI imaging exhibits multi level mild to moderate degenerative changes. Moderate facet arthropathy noted at L3-L4. There is a facet joint effusion on the left at this level. No high grade spinal canal stenosis noted.   PATIENT SURVEYS:  Patient-Specific Activity Scoring Scheme  "0" represents "unable to perform." "10" represents "able to perform at prior level. 0 1 2 3 4 5 6 7 8 9  10 (Date and Score)   Activity 03/11/2024  04/08/2024  1. Walk for long distances 4 5   2. Get up and down without pain 7  10  3. Stand for a long time 5 5  4.    5.    Score 5.33/10 6.667   Total score = sum of the activity scores/number of activities Minimum  detectable change (90%CI) for average score = 2 points Minimum detectable change (90%CI) for single activity score = 3 points  SCREENING FOR RED FLAGS: 03/11/2024 Bowel or bladder incontinence: No Cauda equina syndrome: No  COGNITION: 03/11/2024 Overall cognitive status: WFL normal      SENSATION: 03/11/2024 WFL  MUSCLE LENGTH: 03/11/2024 Not tested  POSTURE:  03/11/2024 No Significant postural limitations  PALPATION: 03/11/2024 Tender to piriformis, glutes, and low back. Bilateral lumps noted of equal size on low back that are moveable and very tender to patient  LUMBAR ROM:  03/11/2024 Directional Preference Assessment: nonspecific Centralization: none observed Peripheralization:  none observed  AROM 03/11/2024  Flexion Reaches ankles  Extension 90%  Right lateral flexion Reaches thigh  Left lateral flexion Reaches thigh  Right rotation 75% Painful/symptomatic  Left rotation 75% Painful/symptomatic   (Blank rows = not tested)  LOWER EXTREMITY ROM:      Right 03/11/2024 Left 03/11/2024  Hip flexion    Hip extension    Hip abduction    Hip adduction    Hip internal rotation Limited due to pain Limited due to pain  Hip external rotation WFL; non-painful WFL; Non-painful  Knee flexion    Knee extension    Ankle dorsiflexion    Ankle plantarflexion    Ankle inversion    Ankle eversion     (Blank rows = not tested)  LOWER EXTREMITY MMT:    MMT Right 03/11/2024 Left 03/11/2024 Right 04/19/2024 Left 04/19/2024  Hip flexion 4 5 5/5 5/5  Hip extension   5/5 3+/5  Hip abduction 4 4-  3+/5  Hip adduction      Hip internal rotation      Hip external rotation      Knee flexion 5 5    Knee extension 5 5  Ankle dorsiflexion 5 5    Ankle plantarflexion      Ankle inversion      Ankle eversion       (Blank rows = not tested)  LUMBAR SPECIAL TESTS:  03/11/2024 Slump test: Negative bilaterally   FUNCTIONAL TESTS:  03/11/2024 None  tested  GAIT: 03/11/2024 Ambulates clinical distances without AD. Wide step length, appropriate foot clearance, and cadence                                                                                                                                                                                                                   TODAY'S TREATMENT:                                                                                                         DATE: 04/19/2024  Therex: Nustep lvl 6 10 mins UE/LE Lt hip clam shell 2 x 15 in sidelying  Supine bridge c green band hip abduction hold 2 x 10  Supine knee to opposite shoulder 15 sec x 3 bilateral Standing hip hike in doorway 5 sec hold x 10 bilateral (added to HEP) Lateral step down 4 inch light hand held touch 2 x 10 bilateral   Manual Rt sidelying Lt lateral hip/thigh percussive device.   TODAY'S TREATMENT:                                                                                                         DATE: 04/08/2024  Therex: Lt hip clam shell 2 x 15 Supine bridge c green band hip abduction hold 2 x 10  Supine knee to opposite shoulder  15 sec x 3 bilateral Supine figure 4 push away 15 sec x 3 bilateral Additional time spent in verbal review of exercise techniques Nustep lvl 6 9 mins UE/LE    Manual Rt sidelying Lt lateral hip/thigh percussive device.     TODAY'S TREATMENT:                                                                                                         DATE:  03/21/24:  TherEx:  Bridges: 2 x 10 holding 5 sec Seated clam shells green TB Trunk rotation x 4 bil holding 30 sec Hamstring stretch: x 3 holding 20 sec bil LE SLR: 2 x 10 bil LE TherActivities:  30 sec sit to stand: 10 times c no UE support Leg Press: 62# x 10, single leg 31# x 10 bil LE Side stepping 20 feet x 4  Side stepping over 6 inch hurdle Step ups on 6 inch step x 10 bil LE, c single UE support Manual:  Percussion to left  lumbar paraspinals and down Left lateral quad/IT band  TODAY'S TREATMENT:                                                                                                         DATE:03/18/24  Nustep L5x8 minutes all four extremities for w/u, tissue perfusion and mm endurance  Bridges + ABD into red TB 2x12  Sidelying hip ABD red TB 1x10 B  Figure 4 stretch 2x30 seconds Hooklying HS stretches 2x30 seconds B  Standing hip hikes x12 B   Percussion gun lateral L thigh with towel for padding to address mm spasms and trigger points     PATIENT EDUCATION:  Education details: HEP update Person educated: Patient Education method: Programmer, multimedia, Demonstration, Verbal cues, and Handouts Education comprehension: verbalized understanding, returned demonstration, and verbal cues required  HOME EXERCISE PROGRAM: Access Code: [email or web address] URL: [email or web address] Date: 04/19/2024 Prepared by: Bonna Bustard  Exercises - Supine Figure 4 Piriformis Stretch  - 2-3 x daily - 7 x weekly - 1 sets - 5 reps - 15-30 hold - Supine Piriformis Stretch with Foot on Ground  - 2-3 x daily - 7 x weekly - 1 sets - 5 reps - 15-30 hold - Supine Lower Trunk Rotation  - 2-3 x daily - 7 x weekly - 1 sets - 3-5 reps - 15 hold - Supine Bridge  - 1-2 x daily - 7 x weekly - 1-2 sets - 10 reps - 2 hold - Clamshell  - 1-2 x daily - 7 x weekly - 2-3 sets - 10 reps - Sit to Stand  - 3 x  daily - 7 x weekly - 1 sets - 10 reps - Standing Hip Hiking  - 1-2 x daily - 7 x weekly - 1 sets - 10 reps - 5 hold  ASSESSMENT:  CLINICAL IMPRESSION: Weakness in Lt hip abduction, extension still limited compared to Rt and directly impactful in pain symptoms.  Continued skilled PT services indicated at this time to progress towards goals.    OBJECTIVE IMPAIRMENTS: Abnormal gait, decreased activity tolerance, decreased balance, decreased endurance, decreased knowledge of condition, decreased ROM, decreased strength,  increased muscle spasms, impaired flexibility, and pain.   ACTIVITY LIMITATIONS: carrying, lifting, bending, sitting, standing, squatting, sleeping, stairs, and locomotion level  PARTICIPATION LIMITATIONS: meal prep, cleaning, laundry, driving, shopping, and community activity  PERSONAL FACTORS: Age, Time since onset of injury/illness/exacerbation, and 3+ comorbidities: see above  are also affecting patient's functional outcome.   REHAB POTENTIAL: Good  CLINICAL DECISION MAKING: Evolving/moderate complexity  EVALUATION COMPLEXITY: Moderate   GOALS: Goals reviewed with patient? Yes  SHORT TERM GOALS: (target date for Short term goals are 3 weeks 04/01/2024)  1. Patient will demonstrate independent use of home exercise program to maintain progress from in clinic treatments.  Goal status: MET 03/21/24  LONG TERM GOALS: (target dates for all long term goals are 10 weeks  05/20/2024 )   1. Patient will demonstrate/report pain at worst less than or equal to 2/10 to facilitate minimal limitation in daily activity secondary to pain symptoms.  Goal status: on going 04/19/2024   2. Patient will demonstrate independent use of home exercise program to facilitate ability to maintain/progress functional gains from skilled physical therapy services.  Goal status: on going 04/19/2024   3. Patient will demonstrate Patient specific functional scale avg > or = 8 to indicate reduced disability due to condition.   Goal status: on going 04/19/2024   4. Patient will demonstrate lumbar rotation 100 % WFL s symptoms to facilitate upright standing, walking posture at PLOF s limitation.  Goal status: on going 04/19/2024   5.  Patient will demonstrate STS without onset of symptoms to meet PLOF Goal status: On-going 03/21/24   6.  Patient will report long distance ambulation without pain or increased symptoms Goal status: on going 04/19/2024    PLAN:  PT FREQUENCY: 1-2x/week  PT DURATION: 10  weeks  PLANNED INTERVENTIONS: Can include 78295- PT Re-evaluation, 97110-Therapeutic exercises, 97530- Therapeutic activity, 97112- Neuromuscular re-education, 97535- Self Care, 97140- Manual therapy, [phone]- Gait training, [phone]- Orthotic Fit/training, [phone]- Canalith repositioning, [id number]- Aquatic Therapy, [phone]- Electrical stimulation (unattended), 97750 Physical performance testing, [id number]- Electrical stimulation (manual), 97016- Vasopneumatic device, [id number]- Ultrasound, [id number]- Traction (mechanical), [id number]- Ionotophoresis 4mg /ml Dexamethasone , Patient/Family education, Balance training, Stair training, Taping, Dry Needling, Joint mobilization, Joint manipulation, Spinal manipulation, Spinal mobilization, Scar mobilization, Vestibular training, Visual/preceptual remediation/compensation, DME instructions, Cryotherapy, and Moist heat.  All performed as medically necessary.  All included unless contraindicated  PLAN FOR NEXT SESSION: Continue posterior/lateral hip strengthening.     Bonna Bustard, PT, DPT, OCS, ATC 04/19/24  11:40 AM

## 2024-04-26 ENCOUNTER — Ambulatory Visit (INDEPENDENT_AMBULATORY_CARE_PROVIDER_SITE_OTHER): Admitting: Rehabilitative and Restorative Service Providers"

## 2024-04-26 DIAGNOSIS — M6281 Muscle weakness (generalized): Secondary | ICD-10-CM

## 2024-04-26 DIAGNOSIS — M5459 Other low back pain: Secondary | ICD-10-CM

## 2024-04-26 NOTE — Therapy (Addendum)
 OUTPATIENT PHYSICAL THERAPY TREATMENT    Patient Name: Sheena Trevino MRN: [id number] DOB:[birth year], 78 y.o., female Today's Date: 04/26/2024  END OF SESSION:  PT End of Session - 04/26/24 1050     Visit Number 6    Number of Visits 20    Date for PT Re-Evaluation 05/20/24    Authorization Type Medicare A and B    Progress Note Due on Visit 10    PT Start Time 1053    PT Stop Time 1132    PT Time Calculation (min) 39 min    Activity Tolerance Patient tolerated treatment well    Behavior During Therapy WFL for tasks assessed/performed                  Past Medical History:  Diagnosis Date   Anemia    in late twenties   Anxiety    Arthritis    "knees, fingers" (09/05/2014)   Diabetes mellitus without complication (HCC)    GERD (gastroesophageal reflux disease)    H/O hiatal hernia    Hyperlipidemia    Hypertension    Insomnia    Irritable bowel    with stress   Obesity    PONV (postoperative nausea and vomiting)    Past Surgical History:  Procedure Laterality Date   ABDOMINAL HYSTERECTOMY  1982   partial   BREAST BIOPSY Left    COLONOSCOPY     FOOT SURGERY Right ~ 2004   "top of my foot"   SHOULDER ARTHROSCOPY WITH OPEN ROTATOR CUFF REPAIR Right ~ 2000   THYROID  LOBECTOMY Right 09/05/2014   THYROID  LOBECTOMY Right 09/05/2014   Procedure: RIGHT THYROID  LOBECTOMY;  Surgeon: Oralee Billow, MD;  Location: [facility] OR;  Service: General;  Laterality: Right;   TUBAL LIGATION  1981   Patient Active Problem List   Diagnosis Date Noted   Diabetes mellitus without complication (HCC)    Colon polyps 12/29/2014   Right thyroid  nodule 09/05/2014   Neoplasm of uncertain behavior of thyroid  gland, 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  Did report pain from Rt heel over last few days from hitting.   PERTINENT HISTORY:  Fibromyalgia, polymyalgia rheumatica, DM, GERD, HTN  PAIN:  NPRS scale: up to 2-3/10 Pain location: Lt hip and down Pain description: sore  Aggravating factors: laying on hard floor  Relieving factors: exercises  PRECAUTIONS: None  WEIGHT BEARING RESTRICTIONS: No  FALLS:  Has patient fallen in last 6 months? No  LIVING ENVIRONMENT: Lives with: lives with their family and lives with their spouse Lives in: House/apartment Stairs: Yes: External: 2 steps; none Has following equipment at home: None  OCCUPATION: retired  PLOF: Independent  PATIENT GOALS: "to get rid of the pain"  Next MD Visit: none scheduled at time of eval   OBJECTIVE:   DIAGNOSTIC FINDINGS:  Per epic Elvan Hamel, NP note 03/03/2024):  Recent lumbar MRI imaging exhibits multi level mild to moderate degenerative changes. Moderate facet arthropathy noted at L3-L4. There is a facet joint effusion on the left at this level. No high grade spinal canal stenosis noted.   PATIENT SURVEYS:  Patient-Specific Activity Scoring Scheme  "0" represents "unable to perform." "10" represents "able to perform at prior level. 0 1 2 3 4 5 6 7 8 9  10 (Date and Score)   Activity 03/11/2024  04/08/2024  1. Walk for long distances 4 5   2. Get up and down without pain 7  10  3. Stand for a long time 5 5  4.    5.    Score 5.33/10 6.667   Total score = sum of the activity scores/number of  activities Minimum detectable change (90%CI) for average score = 2 points Minimum detectable change (90%CI) for single activity score = 3 points  SCREENING FOR RED FLAGS: 03/11/2024 Bowel or bladder incontinence: No Cauda equina syndrome: No  COGNITION: 03/11/2024 Overall cognitive status: WFL normal      SENSATION: 03/11/2024 WFL  MUSCLE LENGTH: 03/11/2024 Not tested  POSTURE:  03/11/2024 No Significant postural limitations  PALPATION: 03/11/2024 Tender to piriformis, glutes, and low back. Bilateral lumps noted of equal size on low back that are moveable and very tender to patient  LUMBAR ROM:  03/11/2024 Directional Preference Assessment: nonspecific Centralization: none observed Peripheralization:  none observed  AROM 03/11/2024 04/26/2024  Flexion Reaches ankles   Extension 90% 100%  Right lateral flexion Reaches thigh   Left lateral flexion Reaches thigh   Right rotation 75% Painful/symptomatic   Left rotation 75% Painful/symptomatic    (Blank rows = not tested)  LOWER EXTREMITY ROM:      Right 03/11/2024 Left 03/11/2024  Hip flexion    Hip extension    Hip abduction    Hip adduction    Hip internal rotation Limited due to pain Limited due to pain  Hip external rotation WFL; non-painful WFL; Non-painful  Knee flexion    Knee extension    Ankle dorsiflexion    Ankle plantarflexion    Ankle inversion    Ankle eversion     (Blank rows = not tested)  LOWER EXTREMITY MMT:    MMT Right 03/11/2024 Left 03/11/2024 Right 04/19/2024 Left 04/19/2024  Hip flexion 4 5 5/5 5/5  Hip extension   5/5 3+/5  Hip abduction 4 4-  3+/5  Hip adduction      Hip internal rotation      Hip external rotation      Knee flexion 5 5  Knee extension 5 5    Ankle dorsiflexion 5 5    Ankle plantarflexion      Ankle inversion      Ankle eversion       (Blank rows = not tested)  LUMBAR SPECIAL TESTS:  03/11/2024 Slump test: Negative bilaterally   FUNCTIONAL TESTS:   03/11/2024 None tested  GAIT: 03/11/2024 Ambulates clinical distances without AD. Wide step length, appropriate foot clearance, and cadence                                                                                                                                                                                                                   TODAY'S TREATMENT:                                                                                                         DATE: 04/26/2024  Therex: Nustep lvl 6 10.5 mins UE/LE Lt hip clam shell 2 x 15 in sidelying  Supine bridge c blue  band hip abduction hold 2 x 10  Supine figure 4 pull towards 15 sec x 3 bilateral  Supine knee to chest 15 sec x 3 bilateral  Standing hip hike in doorway 3 sec hold x 10 bilateral   Manual Rt sidelying Lt lateral hip/thigh percussive device.   TODAY'S TREATMENT:                                                                                                         DATE: 04/19/2024  Therex: Nustep lvl 6 10 mins UE/LE Lt hip clam shell 2 x 15 in sidelying  Supine bridge c green band  hip abduction hold 2 x 10  Supine knee to opposite shoulder 15 sec x 3 bilateral Standing hip hike in doorway 5 sec hold x 10 bilateral (added to HEP) Lateral step down 4 inch light hand held touch 2 x 10 bilateral   Manual Rt sidelying Lt lateral hip/thigh percussive device.   TODAY'S TREATMENT:                                                                                                         DATE: 04/08/2024  Therex: Lt hip clam shell 2 x 15 Supine bridge c green band hip abduction hold 2 x 10  Supine knee to opposite shoulder 15 sec x 3 bilateral Supine figure 4 push away 15 sec x 3 bilateral Additional time spent in verbal review of exercise techniques Nustep lvl 6 9 mins UE/LE    Manual Rt sidelying Lt lateral hip/thigh percussive device.     TODAY'S TREATMENT:                                                                                                          DATE:  03/21/24:  TherEx:  Bridges: 2 x 10 holding 5 sec Seated clam shells green TB Trunk rotation x 4 bil holding 30 sec Hamstring stretch: x 3 holding 20 sec bil LE SLR: 2 x 10 bil LE TherActivities:  30 sec sit to stand: 10 times c no UE support Leg Press: 62# x 10, single leg 31# x 10 bil LE Side stepping 20 feet x 4  Side stepping over 6 inch hurdle Step ups on 6 inch step x 10 bil LE, c single UE support Manual:  Percussion to left lumbar paraspinals and down Left lateral quad/IT band   PATIENT EDUCATION:  Education details: HEP update Person educated: Patient Education method: Programmer, multimedia, Demonstration, Verbal cues, and Handouts Education comprehension: verbalized understanding, returned demonstration, and verbal cues required  HOME EXERCISE PROGRAM: Access Code: [email or web address] URL: [email or web address] Date: 04/19/2024 Prepared by: Bonna Bustard  Exercises - Supine Figure 4 Piriformis Stretch  - 2-3 x daily - 7 x weekly - 1 sets - 5 reps - 15-30 hold - Supine Piriformis Stretch with Foot on Ground  - 2-3 x daily - 7 x weekly - 1 sets - 5 reps - 15-30 hold - Supine Lower Trunk Rotation  - 2-3 x daily - 7 x weekly - 1 sets - 3-5 reps - 15 hold - Supine Bridge  - 1-2 x daily - 7 x weekly - 1-2 sets - 10 reps - 2 hold - Clamshell  - 1-2 x daily - 7 x weekly -  2-3 sets - 10 reps - Sit to Stand  - 3 x daily - 7 x weekly - 1 sets - 10 reps - Standing Hip Hiking  - 1-2 x daily - 7 x weekly - 1 sets - 10 reps - 5 hold  ASSESSMENT:  CLINICAL IMPRESSION: Pt has continued to report improvement in management of symptoms.  Good knowledge of HEP noted.  Pt may continue to benefit from manual soft tissue work as necessary with goals of HEP transitioning in able.    OBJECTIVE IMPAIRMENTS: Abnormal gait, decreased activity tolerance, decreased balance, decreased endurance, decreased knowledge of condition, decreased  ROM, decreased strength, increased muscle spasms, impaired flexibility, and pain.   ACTIVITY LIMITATIONS: carrying, lifting, bending, sitting, standing, squatting, sleeping, stairs, and locomotion level  PARTICIPATION LIMITATIONS: meal prep, cleaning, laundry, driving, shopping, and community activity  PERSONAL FACTORS: Age, Time since onset of injury/illness/exacerbation, and 3+ comorbidities: see above  are also affecting patient's functional outcome.   REHAB POTENTIAL: Good  CLINICAL DECISION MAKING: Evolving/moderate complexity  EVALUATION COMPLEXITY: Moderate   GOALS: Goals reviewed with patient? Yes  SHORT TERM GOALS: (target date for Short term goals are 3 weeks 04/01/2024)  1. Patient will demonstrate independent use of home exercise program to maintain progress from in clinic treatments.  Goal status: MET 03/21/24  LONG TERM GOALS: (target dates for all long term goals are 10 weeks  05/20/2024 )   1. Patient will demonstrate/report pain at worst less than or equal to 2/10 to facilitate minimal limitation in daily activity secondary to pain symptoms.  Goal status: on going 04/19/2024   2. Patient will demonstrate independent use of home exercise program to facilitate ability to maintain/progress functional gains from skilled physical therapy services.  Goal status: on going 04/19/2024   3. Patient will demonstrate Patient specific functional scale avg > or = 8 to indicate reduced disability due to condition.   Goal status: on going 04/19/2024   4. Patient will demonstrate lumbar rotation 100 % WFL s symptoms to facilitate upright standing, walking posture at PLOF s limitation.  Goal status: on going 04/19/2024   5.  Patient will demonstrate STS without onset of symptoms to meet PLOF Goal status: On-going 03/21/24   6.  Patient will report long distance ambulation without pain or increased symptoms Goal status: on going 04/19/2024    PLAN:  PT FREQUENCY:  1-2x/week  PT DURATION: 10 weeks  PLANNED INTERVENTIONS: Can include 16109- PT Re-evaluation, 97110-Therapeutic exercises, 97530- Therapeutic activity, 97112- Neuromuscular re-education, 97535- Self Care, 97140- Manual therapy, [phone]- Gait training, [phone]- Orthotic Fit/training, [phone]- Canalith repositioning, [id number]- Aquatic Therapy, [phone]- Electrical stimulation (unattended), 97750 Physical performance testing, [id number]- Electrical stimulation (manual), 97016- Vasopneumatic device, [id number]- Ultrasound, [id number]- Traction (mechanical), [id number]- Ionotophoresis 4mg /ml Dexamethasone , Patient/Family education, Balance training, Stair training, Taping, Dry Needling, Joint mobilization, Joint manipulation, Spinal manipulation, Spinal mobilization, Scar mobilization, Vestibular training, Visual/preceptual remediation/compensation, DME instructions, Cryotherapy, and Moist heat.  All performed as medically necessary.  All included unless contraindicated  PLAN FOR NEXT SESSION: Check on HEP confidence for transitioning when able.     Bonna Bustard, PT, DPT, OCS, ATC 04/26/24  11:31 AM

## 2024-05-03 ENCOUNTER — Ambulatory Visit (INDEPENDENT_AMBULATORY_CARE_PROVIDER_SITE_OTHER): Admitting: Rehabilitative and Restorative Service Providers"

## 2024-05-03 ENCOUNTER — Encounter: Payer: Self-pay | Admitting: Rehabilitative and Restorative Service Providers"

## 2024-05-03 DIAGNOSIS — M6281 Muscle weakness (generalized): Secondary | ICD-10-CM

## 2024-05-03 DIAGNOSIS — M5459 Other low back pain: Secondary | ICD-10-CM

## 2024-05-03 NOTE — Therapy (Addendum)
 OUTPATIENT PHYSICAL THERAPY TREATMENT  / DISCHARGE   Patient Name: Sheena Trevino MRN: [id number] DOB:Aug 10, 1946, 78 y.o., female Today's Date: 05/03/2024  END OF SESSION:  PT End of Session - 05/03/24 1100     Visit Number 7    Number of Visits 20    Date for PT Re-Evaluation 05/20/24    Authorization Type Medicare A and B    Progress Note Due on Visit 10    PT Start Time 1049    PT Stop Time 1128    PT Time Calculation (min) 39 min    Activity Tolerance Patient tolerated treatment well    Behavior During Therapy WFL for tasks assessed/performed                   Past Medical History:  Diagnosis Date   Anemia    in late twenties   Anxiety    Arthritis    knees, fingers (09/05/2014)   Diabetes mellitus without complication (HCC)    GERD (gastroesophageal reflux disease)    H/O hiatal hernia    Hyperlipidemia    Hypertension    Insomnia    Irritable bowel    with stress   Obesity    PONV (postoperative nausea and vomiting)    Past Surgical History:  Procedure Laterality Date   ABDOMINAL HYSTERECTOMY  1982   partial   BREAST BIOPSY Left    COLONOSCOPY     FOOT SURGERY Right ~ 2004   top of my foot   SHOULDER ARTHROSCOPY WITH OPEN ROTATOR CUFF REPAIR Right ~ 2000   THYROID  LOBECTOMY Right 09/05/2014   THYROID  LOBECTOMY Right 09/05/2014   Procedure: RIGHT THYROID  LOBECTOMY;  Surgeon: Krystal Spinner, MD;  Location: [facility] OR;  Service: General;  Laterality: Right;   TUBAL LIGATION  1981   Patient Active Problem List   Diagnosis Date Noted   Diabetes mellitus without complication (HCC)    Colon polyps 12/29/2014   Right thyroid  nodule 09/05/2014   Neoplasm of uncertain behavior of thyroid  gland, right 01/03/2013    PCP: Cleotilde Planas, MD   REFERRING PROVIDER: Trudy Duwaine BRAVO, NP   REFERRING DIAG:  [phone] (ICD-10-CM) - Chronic bilateral low back pain without sciatica  M47.816 (ICD-10-CM) - Facet arthropathy, lumbar  M79.7 (ICD-10-CM) -  Fibromyalgia    Rationale for Evaluation and Treatment: Rehabilitation  THERAPY DIAG:  Other low back pain  Muscle weakness (generalized)  ONSET DATE: chronic  SUBJECTIVE:  SUBJECTIVE STATEMENT: Pt indicated going multiple days without patches and has been feeling pretty good.  Pt indicated she felt comfortable with trial for HEP at this time.   Reported 95-96% back to normal at this time.   PERTINENT HISTORY:  Fibromyalgia, polymyalgia rheumatica, DM, GERD, HTN  PAIN:  NPRS scale: up to 2-3/10 Pain location: Lt hip and down Pain description: sore  Aggravating factors: laying on hard floor  Relieving factors: exercises  PRECAUTIONS: None  WEIGHT BEARING RESTRICTIONS: No  FALLS:  Has patient fallen in last 6 months? No  LIVING ENVIRONMENT: Lives with: lives with their family and lives with their spouse Lives in: House/apartment Stairs: Yes: External: 2 steps; none Has following equipment at home: None  OCCUPATION: retired  PLOF: Independent  PATIENT GOALS: to get rid of the pain  Next MD Visit: none scheduled at time of eval   OBJECTIVE:   DIAGNOSTIC FINDINGS:  Per epic Johnnie Pouch, NP note 03/03/2024):  Recent lumbar MRI imaging exhibits multi level mild to moderate degenerative changes. Moderate facet arthropathy noted at L3-L4. There is a facet joint effusion on the left at this level. No high grade spinal canal stenosis noted.   PATIENT SURVEYS:  Patient-Specific Activity Scoring Scheme  0 represents "unable to perform." 10 represents "able to perform at prior level. 0 1 2 3 4 5 6 7 8 9  10 (Date and Score)   Activity 03/11/2024  04/08/2024 05/03/2024  1. Walk for long distances 4 5  10   2. Get up and down without pain 7  10 10   3. Stand for a long time 5 5 9   4.      5.     Score 5.33/10 6.667 9.667   Total score = sum of the activity scores/number of activities Minimum detectable change (90%CI) for average score = 2 points Minimum detectable change (90%CI) for single activity score = 3 points  SCREENING FOR RED FLAGS: 03/11/2024 Bowel or bladder incontinence: No Cauda equina syndrome: No  COGNITION: 03/11/2024 Overall cognitive status: WFL normal      SENSATION: 03/11/2024 WFL  MUSCLE LENGTH: 03/11/2024 Not tested  POSTURE:  03/11/2024 No Significant postural limitations  PALPATION: 03/11/2024 Tender to piriformis, glutes, and low back. Bilateral lumps noted of equal size on low back that are moveable and very tender to patient  LUMBAR ROM:  03/11/2024 Directional Preference Assessment: nonspecific Centralization: none observed Peripheralization:  none observed  AROM 03/11/2024 04/26/2024 05/03/2024  Flexion Reaches ankles    Extension 90% 100% 100% without complaints  Right lateral flexion Reaches thigh  100% without complaints  Left lateral flexion Reaches thigh  100% without complaints  Right rotation 75% Painful/symptomatic  100% without complaints  Left rotation 75% Painful/symptomatic  100% without complaints   (Blank rows = not tested)  LOWER EXTREMITY ROM:      Right 03/11/2024 Left 03/11/2024  Hip flexion    Hip extension    Hip abduction    Hip adduction    Hip internal rotation Limited due to pain Limited due to pain  Hip external rotation WFL; non-painful WFL; Non-painful  Knee flexion    Knee extension    Ankle dorsiflexion    Ankle plantarflexion    Ankle inversion    Ankle eversion     (Blank rows = not tested)  LOWER EXTREMITY MMT:    MMT Right 03/11/2024 Left 03/11/2024 Right 04/19/2024 Left 04/19/2024  Hip flexion 4 5 5/5 5/5  Hip extension   5/5 3+/5  Hip abduction 4 4-  3+/5  Hip adduction      Hip internal rotation      Hip external rotation      Knee flexion 5 5    Knee extension 5 5     Ankle dorsiflexion 5 5    Ankle plantarflexion      Ankle inversion      Ankle eversion       (Blank rows = not tested)  LUMBAR SPECIAL TESTS:  03/11/2024 Slump test: Negative bilaterally   FUNCTIONAL TESTS:  03/11/2024 None tested  GAIT: 03/11/2024 Ambulates clinical distances without AD. Wide step length, appropriate foot clearance, and cadence                                                                                                                                                                                                                   TODAY'S TREATMENT:                                                                                                         DATE: 05/03/2024  Therex: Nustep lvl 6 10  mins UE/LE Standing hip abduction green band x 10 bilateral Standing lateral stepping in // bars with minimal HHA 10 ft x 4 each way Time spent in review of existing HEP and additions for continued progression.  Handout provided.    Provided links for possible massage gun purchase.   Manual Rt sidelying Lt lateral hip/thigh percussive device.  TODAY'S TREATMENT:  DATE: 04/26/2024  Therex: Nustep lvl 6 10.5 mins UE/LE Lt hip clam shell 2 x 15 in sidelying  Supine bridge c blue  band hip abduction hold 2 x 10  Supine figure 4 pull towards 15 sec x 3 bilateral  Supine knee to chest 15 sec x 3 bilateral  Standing hip hike in doorway 3 sec hold x 10 bilateral   Manual Rt sidelying Lt lateral hip/thigh percussive device.   TODAY'S TREATMENT:                                                                                                         DATE: 04/19/2024  Therex: Nustep lvl 6 10 mins UE/LE Lt hip clam shell 2 x 15 in sidelying  Supine bridge c green band hip abduction hold 2 x 10  Supine knee to opposite shoulder 15 sec x 3 bilateral Standing hip hike in  doorway 5 sec hold x 10 bilateral (added to HEP) Lateral step down 4 inch light hand held touch 2 x 10 bilateral   Manual Rt sidelying Lt lateral hip/thigh percussive device.   TODAY'S TREATMENT:                                                                                                         DATE: 04/08/2024  Therex: Lt hip clam shell 2 x 15 Supine bridge c green band hip abduction hold 2 x 10  Supine knee to opposite shoulder 15 sec x 3 bilateral Supine figure 4 push away 15 sec x 3 bilateral Additional time spent in verbal review of exercise techniques Nustep lvl 6 9 mins UE/LE    Manual Rt sidelying Lt lateral hip/thigh percussive device.     TODAY'S TREATMENT:                                                                                                         DATE:  03/21/24:  TherEx:  Bridges: 2 x 10 holding 5 sec Seated clam shells green TB Trunk rotation x 4 bil holding 30 sec Hamstring stretch: x 3 holding 20 sec bil LE SLR: 2 x 10 bil LE TherActivities:  30 sec sit to stand: 10 times  c no UE support Leg Press: 62# x 10, single leg 31# x 10 bil LE Side stepping 20 feet x 4  Side stepping over 6 inch hurdle Step ups on 6 inch step x 10 bil LE, c single UE support Manual:  Percussion to left lumbar paraspinals and down Left lateral quad/IT band   PATIENT EDUCATION:  Education details: HEP update Person educated: Patient Education method: Programmer, multimedia, Demonstration, Verbal cues, and Handouts Education comprehension: verbalized understanding, returned demonstration, and verbal cues required  HOME EXERCISE PROGRAM: Access Code: [email or web address] URL: [email or web address] Date: 05/03/2024 Prepared by: Ozell Silvan  Exercises - Supine Figure 4 Piriformis Stretch  - 2-3 x daily - 7 x weekly - 1 sets - 5 reps - 15-30 hold - Supine Piriformis Stretch with Foot on Ground  - 2-3 x daily - 7 x weekly - 1 sets - 5 reps - 15-30 hold - Supine Lower Trunk  Rotation  - 2-3 x daily - 7 x weekly - 1 sets - 3-5 reps - 15 hold - Supine Bridge  - 1-2 x daily - 7 x weekly - 1-2 sets - 10 reps - 2 hold - Clamshell  - 1-2 x daily - 7 x weekly - 2-3 sets - 10 reps - Sit to Stand  - 3 x daily - 7 x weekly - 1 sets - 10 reps - Standing Hip Hiking  - 1-2 x daily - 7 x weekly - 1 sets - 10 reps - 5 hold - Standing Hip Abduction with Resistance at Ankles and Counter Support  - 1 x daily - 4-5 x weekly - 1-2 sets - 10-15 reps - Side Stepping with Resistance at Ankles and Counter Support  - 1 x daily - 4-5 x weekly - 1 sets - 3-4 reps  ASSESSMENT:  CLINICAL IMPRESSION: Pt has attended 7 visits with good report of improvement and pain reduction.  At this time, Pt was in agreement with plan for HEP transitioning trial at this time.  Will keep plan open for return based off symptoms with discharge after 30 days inactivity.     OBJECTIVE IMPAIRMENTS: Abnormal gait, decreased activity tolerance, decreased balance, decreased endurance, decreased knowledge of condition, decreased ROM, decreased strength, increased muscle spasms, impaired flexibility, and pain.   ACTIVITY LIMITATIONS: carrying, lifting, bending, sitting, standing, squatting, sleeping, stairs, and locomotion level  PARTICIPATION LIMITATIONS: meal prep, cleaning, laundry, driving, shopping, and community activity  PERSONAL FACTORS: Age, Time since onset of injury/illness/exacerbation, and 3+ comorbidities: see above are also affecting patient's functional outcome.   REHAB POTENTIAL: Good  CLINICAL DECISION MAKING: Evolving/moderate complexity  EVALUATION COMPLEXITY: Moderate   GOALS: Goals reviewed with patient? Yes  SHORT TERM GOALS: (target date for Short term goals are 3 weeks 04/01/2024)  1. Patient will demonstrate independent use of home exercise program to maintain progress from in clinic treatments.  Goal status: MET 03/21/24  LONG TERM GOALS: (target dates for all long term goals are  10 weeks  05/20/2024 )   1. Patient will demonstrate/report pain at worst less than or equal to 2/10 to facilitate minimal limitation in daily activity secondary to pain symptoms.  Goal status: Met 05/03/2024   2. Patient will demonstrate independent use of home exercise program to facilitate ability to maintain/progress functional gains from skilled physical therapy services.  Goal status: Met 05/03/2024   3. Patient will demonstrate Patient specific functional scale avg > or = 8 to indicate reduced disability due to condition.   Goal status: Met  05/03/2024   4. Patient will demonstrate lumbar extension 100 % WFL s symptoms to facilitate upright standing, walking posture at PLOF s limitation.  Goal status: Met 05/03/2024   5.  Patient will demonstrate STS without onset of symptoms to meet PLOF Goal status: Met 05/03/2024   6.  Patient will report long distance ambulation without pain or increased symptoms Goal status:Met 05/03/2024    PLAN:  PT FREQUENCY: 1-2x/week  PT DURATION: 10 weeks  PLANNED INTERVENTIONS: Can include 02853- PT Re-evaluation, 97110-Therapeutic exercises, 97530- Therapeutic activity, 97112- Neuromuscular re-education, 97535- Self Care, 97140- Manual therapy, [phone]- Gait training, [phone]- Orthotic Fit/training, [phone]- Canalith repositioning, [id number]- Aquatic Therapy, [phone]- Electrical stimulation (unattended), 97750 Physical performance testing, [id number]- Electrical stimulation (manual), 97016- Vasopneumatic device, [id number]- Ultrasound, [id number]- Traction (mechanical), [id number]- Ionotophoresis 4mg /ml Dexamethasone , Patient/Family education, Balance training, Stair training, Taping, Dry Needling, Joint mobilization, Joint manipulation, Spinal manipulation, Spinal mobilization, Scar mobilization, Vestibular training, Visual/preceptual remediation/compensation, DME instructions, Cryotherapy, and Moist heat.  All performed as medically necessary.  All included unless contraindicated  PLAN FOR  NEXT SESSION: HEP trial, discharge after 30 days inactivity.     Ozell Silvan, PT, DPT, OCS, ATC 05/03/24  11:40 AM   PHYSICAL THERAPY DISCHARGE SUMMARY  Visits from Start of Care: 7  Current functional level related to goals / functional outcomes: See note   Remaining deficits: See note   Education / Equipment: HEP  Patient goals were met. Patient is being discharged due to not returning since the last visit.  Ozell Silvan, PT, DPT, OCS, ATC 08/12/24  10:42 AM

## 2024-05-04 DIAGNOSIS — H43813 Vitreous degeneration, bilateral: Secondary | ICD-10-CM | POA: Diagnosis not present

## 2024-05-10 ENCOUNTER — Encounter: Admitting: Rehabilitative and Restorative Service Providers"

## 2024-05-27 DIAGNOSIS — M353 Polymyalgia rheumatica: Secondary | ICD-10-CM | POA: Diagnosis not present

## 2024-05-27 DIAGNOSIS — M1991 Primary osteoarthritis, unspecified site: Secondary | ICD-10-CM | POA: Diagnosis not present

## 2024-05-27 DIAGNOSIS — M79642 Pain in left hand: Secondary | ICD-10-CM | POA: Diagnosis not present

## 2024-05-27 DIAGNOSIS — R7989 Other specified abnormal findings of blood chemistry: Secondary | ICD-10-CM | POA: Diagnosis not present

## 2024-05-27 DIAGNOSIS — M79641 Pain in right hand: Secondary | ICD-10-CM | POA: Diagnosis not present

## 2024-05-27 DIAGNOSIS — E669 Obesity, unspecified: Secondary | ICD-10-CM | POA: Diagnosis not present

## 2024-05-27 DIAGNOSIS — Z6830 Body mass index (BMI) 30.0-30.9, adult: Secondary | ICD-10-CM | POA: Diagnosis not present

## 2024-06-28 DIAGNOSIS — H26493 Other secondary cataract, bilateral: Secondary | ICD-10-CM | POA: Diagnosis not present

## 2024-06-28 DIAGNOSIS — Z961 Presence of intraocular lens: Secondary | ICD-10-CM | POA: Diagnosis not present

## 2024-06-28 DIAGNOSIS — H18413 Arcus senilis, bilateral: Secondary | ICD-10-CM | POA: Diagnosis not present

## 2024-06-28 DIAGNOSIS — H353131 Nonexudative age-related macular degeneration, bilateral, early dry stage: Secondary | ICD-10-CM | POA: Diagnosis not present

## 2024-06-28 DIAGNOSIS — H26492 Other secondary cataract, left eye: Secondary | ICD-10-CM | POA: Diagnosis not present

## 2024-07-05 DIAGNOSIS — Z961 Presence of intraocular lens: Secondary | ICD-10-CM | POA: Diagnosis not present

## 2024-07-15 ENCOUNTER — Telehealth: Payer: Self-pay | Admitting: Orthopedic Surgery

## 2024-07-15 NOTE — Telephone Encounter (Signed)
 Patient is requesting we hold a date of 08-08-24 at [facility] for LEFT CARPAL TUNNEL RELEASE.  Please provide surgery sheet if surgery is in order.  Thanks.

## 2024-07-26 DIAGNOSIS — H26491 Other secondary cataract, right eye: Secondary | ICD-10-CM | POA: Diagnosis not present

## 2024-07-28 ENCOUNTER — Other Ambulatory Visit: Payer: Self-pay | Admitting: Family Medicine

## 2024-07-28 DIAGNOSIS — Z1231 Encounter for screening mammogram for malignant neoplasm of breast: Secondary | ICD-10-CM

## 2024-08-03 DIAGNOSIS — Z961 Presence of intraocular lens: Secondary | ICD-10-CM | POA: Diagnosis not present

## 2024-08-08 ENCOUNTER — Other Ambulatory Visit: Payer: Self-pay | Admitting: Surgical

## 2024-08-08 DIAGNOSIS — G5602 Carpal tunnel syndrome, left upper limb: Secondary | ICD-10-CM | POA: Diagnosis not present

## 2024-08-08 MED ORDER — TRAMADOL HCL 50 MG PO TABS: 50.0000 mg | ORAL_TABLET | Freq: Four times a day (QID) | ORAL | 0 refills | Status: AC | PRN

## 2024-08-16 ENCOUNTER — Ambulatory Visit
Admission: RE | Admit: 2024-08-16 | Discharge: 2024-08-16 | Disposition: A | Discharge status: Home / Self Care | Source: Ambulatory Visit | Attending: Family Medicine | Admitting: Family Medicine

## 2024-08-16 DIAGNOSIS — Z1231 Encounter for screening mammogram for malignant neoplasm of breast: Secondary | ICD-10-CM | POA: Diagnosis not present

## 2024-08-17 ENCOUNTER — Ambulatory Visit (INDEPENDENT_AMBULATORY_CARE_PROVIDER_SITE_OTHER): Admitting: Orthopedic Surgery

## 2024-08-17 DIAGNOSIS — G5601 Carpal tunnel syndrome, right upper limb: Secondary | ICD-10-CM

## 2024-08-19 ENCOUNTER — Encounter: Admitting: Surgical

## 2024-08-21 ENCOUNTER — Encounter: Payer: Self-pay | Admitting: Orthopedic Surgery

## 2024-08-21 NOTE — Progress Notes (Signed)
   Post-Op Visit Note   Patient: Sheena Trevino           Date of Birth: Nov 07, 1946           MRN: [id number] Visit Date: 08/17/2024 PCP: Cleotilde Planas, MD   Assessment & Plan:  Chief Complaint:  Chief Complaint  Patient presents with   Left Hand - Routine Post Op    LEFT CTR (surgery date 08-08-24)   Visit Diagnoses:  1. Carpal tunnel syndrome, right upper limb     Plan: [name] underwent left carpal tunnel release on 08/08/2024.  States that all of her fingers are awake now.  On exam the incision is intact.  Would like for her to keep the sutures in a few more days it will take about next Monday.  Continue with range of motion of the fingers but no lifting with that hand.  Follow-Up Instructions: No follow-ups on file.   Orders:  No orders of the defined types were placed in this encounter.  No orders of the defined types were placed in this encounter.   Imaging: No results found.  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   Past Medical History:  Diagnosis Date   Anemia    in late twenties   Anxiety    Arthritis    knees, fingers (09/05/2014)   Diabetes mellitus without complication (HCC)    GERD (gastroesophageal reflux disease)    H/O hiatal hernia    Hyperlipidemia    Hypertension    Insomnia    Irritable bowel    with stress   Obesity    PONV (postoperative nausea and vomiting)     Family History  Problem Relation Age of Onset   Cancer Mother        lung   Heart disease Father    Heart attack Father    Diabetes Sister    Breast cancer Daughter 86   Heart attack Paternal Grandfather    Leukemia Brother     Past Surgical History:  Procedure Laterality Date   ABDOMINAL HYSTERECTOMY  1982   partial   BREAST BIOPSY Left    COLONOSCOPY     FOOT  SURGERY Right ~ 2004   top of my foot   SHOULDER ARTHROSCOPY WITH OPEN ROTATOR CUFF REPAIR Right ~ 2000   THYROID  LOBECTOMY Right 09/05/2014   THYROID  LOBECTOMY Right 09/05/2014   Procedure: RIGHT THYROID  LOBECTOMY;  Surgeon: Krystal Spinner, MD;  Location: [facility] OR;  Service: General;  Laterality: Right;   TUBAL LIGATION  1981   Social History   Occupational History   Not on file  Tobacco Use   Smoking status: Never   Smokeless tobacco: Never  Substance and Sexual Activity   Alcohol use: No   Drug use: No   Sexual activity: Not Currently

## 2024-08-22 ENCOUNTER — Ambulatory Visit (INDEPENDENT_AMBULATORY_CARE_PROVIDER_SITE_OTHER): Admitting: Orthopedic Surgery

## 2024-08-22 DIAGNOSIS — G5601 Carpal tunnel syndrome, right upper limb: Secondary | ICD-10-CM

## 2024-08-22 NOTE — Progress Notes (Unsigned)
   Post-Op Visit Note   Patient: Sheena Trevino           Date of Birth: 1946/09/27           MRN: [id number] Visit Date: 08/22/2024 PCP: Cleotilde Planas, MD   Assessment & Plan:  Chief Complaint:  Chief Complaint  Patient presents with   Left Hand - Routine Post Op     LEFT CTR (surgery date 08-08-24)     Visit Diagnoses:  1. Carpal tunnel syndrome, right upper limb     Plan: Patient is now about 2 weeks out left carpal tunnel release.  Incision is healing well.  Sutures removed today.  She will continue to do finger range of motion exercises.  Follow-up with us  as needed.  Avoid lifting for the next 2 weeks minimum.  Follow-Up Instructions: No follow-ups on file.   Orders:  No orders of the defined types were placed in this encounter.  No orders of the defined types were placed in this encounter.   Imaging: No results found.  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   Past Medical History:  Diagnosis Date   Anemia    in late twenties   Anxiety    Arthritis    knees, fingers (09/05/2014)   Diabetes mellitus without complication (HCC)    GERD (gastroesophageal reflux disease)    H/O hiatal hernia    Hyperlipidemia    Hypertension    Insomnia    Irritable bowel    with stress   Obesity    PONV (postoperative nausea and vomiting)     Family History  Problem Relation Age of Onset   Cancer Mother        lung   Heart disease Father    Heart attack Father    Diabetes Sister    Breast cancer Daughter 57   Heart attack Paternal Grandfather    Leukemia Brother     Past Surgical History:  Procedure Laterality Date   ABDOMINAL HYSTERECTOMY  1982   partial   BREAST BIOPSY Left    COLONOSCOPY     FOOT SURGERY Right ~ 2004   top of my foot   SHOULDER  ARTHROSCOPY WITH OPEN ROTATOR CUFF REPAIR Right ~ 2000   THYROID  LOBECTOMY Right 09/05/2014   THYROID  LOBECTOMY Right 09/05/2014   Procedure: RIGHT THYROID  LOBECTOMY;  Surgeon: Krystal Spinner, MD;  Location: [facility] OR;  Service: General;  Laterality: Right;   TUBAL LIGATION  1981   Social History   Occupational History   Not on file  Tobacco Use   Smoking status: Never   Smokeless tobacco: Never  Substance and Sexual Activity   Alcohol use: No   Drug use: No   Sexual activity: Not Currently

## 2024-08-23 ENCOUNTER — Encounter: Payer: Self-pay | Admitting: Orthopedic Surgery

## 2024-08-26 DIAGNOSIS — Z6830 Body mass index (BMI) 30.0-30.9, adult: Secondary | ICD-10-CM | POA: Diagnosis not present

## 2024-08-26 DIAGNOSIS — E669 Obesity, unspecified: Secondary | ICD-10-CM | POA: Diagnosis not present

## 2024-08-26 DIAGNOSIS — R7989 Other specified abnormal findings of blood chemistry: Secondary | ICD-10-CM | POA: Diagnosis not present

## 2024-08-26 DIAGNOSIS — M1991 Primary osteoarthritis, unspecified site: Secondary | ICD-10-CM | POA: Diagnosis not present

## 2024-08-26 DIAGNOSIS — M79672 Pain in left foot: Secondary | ICD-10-CM | POA: Diagnosis not present

## 2024-08-26 DIAGNOSIS — M353 Polymyalgia rheumatica: Secondary | ICD-10-CM | POA: Diagnosis not present

## 2024-09-13 ENCOUNTER — Telehealth: Payer: Self-pay

## 2024-09-13 NOTE — Telephone Encounter (Signed)
 Patient called stating she doesn't come in until next week but her incision feel like there is a splinter coming out making it very sore.

## 2024-09-13 NOTE — Telephone Encounter (Signed)
Scheduled work in.

## 2024-09-14 ENCOUNTER — Ambulatory Visit: Admitting: Orthopedic Surgery

## 2024-09-14 ENCOUNTER — Encounter: Payer: Self-pay | Admitting: Orthopedic Surgery

## 2024-09-14 DIAGNOSIS — G5601 Carpal tunnel syndrome, right upper limb: Secondary | ICD-10-CM

## 2024-09-14 MED ORDER — DOXYCYCLINE HYCLATE 100 MG PO TABS: 100.0000 mg | ORAL_TABLET | Freq: Two times a day (BID) | ORAL | 0 refills | Status: AC

## 2024-09-14 NOTE — Progress Notes (Signed)
   Post-Op Visit Note   Patient: Sheena Trevino           Date of Birth: 16-Jun-1946           MRN: [id number] Visit Date: 09/14/2024 PCP: Cleotilde Planas, MD   Assessment & Plan:  Chief Complaint:  Chief Complaint  Patient presents with   Other    Work in for incision check LEFT CTR (surgery date 08-08-24)    Visit Diagnoses:  1. Carpal tunnel syndrome, right upper limb     Plan: Left hand pain.  Underwent left carpal tunnel surgery 08/08/2024.  Developed some soreness and redness around the incision but no drainage on Sunday.  On exam she has appropriate redness around the incision.  No fluctuance.  Does become normal colored when we raise the hand up above the level of the heart.  Has pretty reasonable grip strength.  Plan at this time is based on some recurrence of pain acutely over the past several days we will put her on doxycycline  100 mg twice daily for 7 days and then see her back in 9 days for clinical recheck.  Follow-Up Instructions: No follow-ups on file.   Orders:  No orders of the defined types were placed in this encounter.  No orders of the defined types were placed in this encounter.   Imaging: No results found.  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   Past Medical History:  Diagnosis Date   Anemia    in late twenties   Anxiety    Arthritis    knees, fingers (09/05/2014)   Diabetes mellitus without complication (HCC)    GERD (gastroesophageal reflux disease)    H/O hiatal hernia    Hyperlipidemia    Hypertension    Insomnia    Irritable bowel    with stress   Obesity    PONV (postoperative nausea and vomiting)     Family History  Problem Relation Age of Onset   Cancer Mother        lung   Heart disease Father    Heart attack Father     Diabetes Sister    Breast cancer Daughter 85   Heart attack Paternal Grandfather    Leukemia Brother     Past Surgical History:  Procedure Laterality Date   ABDOMINAL HYSTERECTOMY  1982   partial   BREAST BIOPSY Left    COLONOSCOPY     FOOT SURGERY Right ~ 2004   top of my foot   SHOULDER ARTHROSCOPY WITH OPEN ROTATOR CUFF REPAIR Right ~ 2000   THYROID  LOBECTOMY Right 09/05/2014   THYROID  LOBECTOMY Right 09/05/2014   Procedure: RIGHT THYROID  LOBECTOMY;  Surgeon: Krystal Spinner, MD;  Location: [facility] OR;  Service: General;  Laterality: Right;   TUBAL LIGATION  1981   Social History   Occupational History   Not on file  Tobacco Use   Smoking status: Never   Smokeless tobacco: Never  Substance and Sexual Activity   Alcohol use: No   Drug use: No   Sexual activity: Not Currently

## 2024-09-14 NOTE — Progress Notes (Deleted)
 Office Visit Note   Patient: Sheena Trevino           Date of Birth: Apr 08, 1946           MRN: [id number] Visit Date: 09/14/2024 Requested by: Cleotilde Planas, MD [address] [place],  [place] [address] PCP: Cleotilde Planas, MD  Subjective: Chief Complaint  Patient presents with   Other    Work in for incision check LEFT CTR (surgery date 08-08-24)     HPI: Sheena Trevino is a 78 y.o. female who presents to the office reporting ***.                ROS: All systems reviewed are negative as they relate to the chief complaint within the history of present illness.  Patient denies fevers or chills.  Assessment & Plan: Visit Diagnoses: No diagnosis found.  Plan: ***  Follow-Up Instructions: No follow-ups on file.   Orders:  No orders of the defined types were placed in this encounter.  No orders of the defined types were placed in this encounter.     Procedures: No procedures performed   Clinical Data: No additional findings.  Objective: Vital Signs: There were no vitals taken for this visit.  Physical Exam:  Constitutional: Patient appears well-developed HEENT:  Head: Normocephalic Eyes:EOM are normal Neck: Normal range of motion Cardiovascular: Normal rate Pulmonary/chest: Effort normal Neurologic: Patient is alert Skin: Skin is warm Psychiatric: Patient has normal mood and affect  Ortho Exam: ***  Specialty Comments:  Lumbar MRI Imaging 02/24/2024 [facility]) -Report under media tab  Imaging: No results found.   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   Past Medical History:  Diagnosis Date   Anemia    in late twenties   Anxiety    Arthritis    knees, fingers (09/05/2014)   Diabetes mellitus without complication (HCC)    GERD  (gastroesophageal reflux disease)    H/O hiatal hernia    Hyperlipidemia    Hypertension    Insomnia    Irritable bowel    with stress   Obesity    PONV (postoperative nausea and vomiting)     Family History  Problem Relation Age of Onset   Cancer Mother        lung   Heart disease Father    Heart attack Father    Diabetes Sister    Breast cancer Daughter 17   Heart attack Paternal Grandfather    Leukemia Brother     Past Surgical History:  Procedure Laterality Date   ABDOMINAL HYSTERECTOMY  1982   partial   BREAST BIOPSY Left    COLONOSCOPY     FOOT SURGERY Right ~ 2004   top of my foot   SHOULDER ARTHROSCOPY WITH OPEN ROTATOR CUFF REPAIR Right ~ 2000   THYROID  LOBECTOMY Right 09/05/2014   THYROID  LOBECTOMY Right 09/05/2014   Procedure: RIGHT THYROID  LOBECTOMY;  Surgeon: Krystal Spinner, MD;  Location: [facility] OR;  Service: General;  Laterality: Right;   TUBAL LIGATION  1981   Social History   Occupational History   Not on file  Tobacco Use   Smoking status: Never   Smokeless tobacco: Never  Substance and Sexual Activity   Alcohol use: No   Drug use: No  Sexual activity: Not Currently

## 2024-09-14 NOTE — Addendum Note (Signed)
 Addended by: Slayde Brault on: 09/14/2024 04:14 PM   Modules accepted: Orders

## 2024-09-23 ENCOUNTER — Ambulatory Visit (INDEPENDENT_AMBULATORY_CARE_PROVIDER_SITE_OTHER): Admitting: Orthopedic Surgery

## 2024-09-23 DIAGNOSIS — G5601 Carpal tunnel syndrome, right upper limb: Secondary | ICD-10-CM

## 2024-09-24 ENCOUNTER — Encounter: Payer: Self-pay | Admitting: Orthopedic Surgery

## 2024-09-24 NOTE — Progress Notes (Signed)
   Post-Op Visit Note   Patient: Sheena Trevino           Date of Birth: March 22, 1946           MRN: [id number] Visit Date: 09/23/2024 PCP: Cleotilde Planas, MD   Assessment & Plan:  Chief Complaint:  Chief Complaint  Patient presents with   Other    Post op follow up Left CTR 08/08/24   Visit Diagnoses:  1. Carpal tunnel syndrome, right upper limb     Plan: [name] is now about 6 weeks out left carpal tunnel release.  Had a little soreness and redness around the incision.  She took some doxycycline  but has had a reaction of nausea to that.  She is feeling some better this afternoon.  Daughter is undergoing surgery related to breast cancer treatment.  She does state that all of the numbness and tingling is gone from her fingers.  Plan at this time is to continue with range of motion and strengthening of the hand and wrist and grip.  It should be noted that she does have an intolerance to doxycycline  which is nausea.  She will follow-up with us  as needed.  Follow-Up Instructions: No follow-ups on file.   Orders:  No orders of the defined types were placed in this encounter.  No orders of the defined types were placed in this encounter.   Imaging: No results found.  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   Past Medical History:  Diagnosis Date   Anemia    in late twenties   Anxiety    Arthritis    knees, fingers (09/05/2014)   Diabetes mellitus without complication (HCC)    GERD (gastroesophageal reflux disease)    H/O hiatal hernia    Hyperlipidemia    Hypertension    Insomnia    Irritable bowel    with stress   Obesity    PONV (postoperative nausea and vomiting)     Family History  Problem Relation Age of Onset   Cancer Mother        lung   Heart disease Father     Heart attack Father    Diabetes Sister    Breast cancer Daughter 69   Heart attack Paternal Grandfather    Leukemia Brother     Past Surgical History:  Procedure Laterality Date   ABDOMINAL HYSTERECTOMY  1982   partial   BREAST BIOPSY Left    COLONOSCOPY     FOOT SURGERY Right ~ 2004   top of my foot   SHOULDER ARTHROSCOPY WITH OPEN ROTATOR CUFF REPAIR Right ~ 2000   THYROID  LOBECTOMY Right 09/05/2014   THYROID  LOBECTOMY Right 09/05/2014   Procedure: RIGHT THYROID  LOBECTOMY;  Surgeon: Krystal Spinner, MD;  Location: [facility] OR;  Service: General;  Laterality: Right;   TUBAL LIGATION  1981   Social History   Occupational History   Not on file  Tobacco Use   Smoking status: Never   Smokeless tobacco: Never  Substance and Sexual Activity   Alcohol use: No   Drug use: No   Sexual activity: Not Currently

## 2024-10-11 DIAGNOSIS — L821 Other seborrheic keratosis: Secondary | ICD-10-CM | POA: Diagnosis not present

## 2024-10-11 DIAGNOSIS — L820 Inflamed seborrheic keratosis: Secondary | ICD-10-CM | POA: Diagnosis not present

## 2024-10-11 DIAGNOSIS — L814 Other melanin hyperpigmentation: Secondary | ICD-10-CM | POA: Diagnosis not present

## 2024-10-11 DIAGNOSIS — L570 Actinic keratosis: Secondary | ICD-10-CM | POA: Diagnosis not present

## 2024-10-11 DIAGNOSIS — D1801 Hemangioma of skin and subcutaneous tissue: Secondary | ICD-10-CM | POA: Diagnosis not present

## 2024-10-11 DIAGNOSIS — L710 Perioral dermatitis: Secondary | ICD-10-CM | POA: Diagnosis not present

## 2024-10-31 ENCOUNTER — Encounter: Payer: Self-pay | Admitting: Radiology

## 2024-11-29 DIAGNOSIS — K219 Gastro-esophageal reflux disease without esophagitis: Secondary | ICD-10-CM | POA: Diagnosis not present

## 2024-11-29 DIAGNOSIS — I10 Essential (primary) hypertension: Secondary | ICD-10-CM | POA: Diagnosis not present

## 2024-11-29 DIAGNOSIS — R062 Wheezing: Secondary | ICD-10-CM | POA: Diagnosis not present

## 2024-11-29 DIAGNOSIS — Z6831 Body mass index (BMI) 31.0-31.9, adult: Secondary | ICD-10-CM | POA: Diagnosis not present
